# Patient Record
Sex: Female | Born: 1940 | Race: White | Hispanic: No | Marital: Married | Smoking: Former smoker
Health system: Southern US, Community
[De-identification: ages and names within clinical notes are randomized; demographics above are authoritative.]

## PROBLEM LIST (undated history)

## (undated) DIAGNOSIS — Z9889 Other specified postprocedural states: Secondary | ICD-10-CM

## (undated) DIAGNOSIS — K635 Polyp of colon: Secondary | ICD-10-CM

## (undated) DIAGNOSIS — M1990 Unspecified osteoarthritis, unspecified site: Secondary | ICD-10-CM

## (undated) DIAGNOSIS — R209 Unspecified disturbances of skin sensation: Secondary | ICD-10-CM

## (undated) DIAGNOSIS — N059 Unspecified nephritic syndrome with unspecified morphologic changes: Secondary | ICD-10-CM

## (undated) DIAGNOSIS — E039 Hypothyroidism, unspecified: Secondary | ICD-10-CM

## (undated) DIAGNOSIS — R112 Nausea with vomiting, unspecified: Secondary | ICD-10-CM

## (undated) DIAGNOSIS — E079 Disorder of thyroid, unspecified: Secondary | ICD-10-CM

## (undated) DIAGNOSIS — K648 Other hemorrhoids: Secondary | ICD-10-CM

## (undated) DIAGNOSIS — D649 Anemia, unspecified: Secondary | ICD-10-CM

## (undated) DIAGNOSIS — A498 Other bacterial infections of unspecified site: Secondary | ICD-10-CM

## (undated) DIAGNOSIS — K5790 Diverticulosis of intestine, part unspecified, without perforation or abscess without bleeding: Secondary | ICD-10-CM

## (undated) HISTORY — PX: POLYPECTOMY: SHX149

## (undated) HISTORY — DX: Disorder of thyroid, unspecified: E07.9

## (undated) HISTORY — PX: TONSILLECTOMY: SUR1361

## (undated) HISTORY — DX: Unspecified osteoarthritis, unspecified site: M19.90

## (undated) HISTORY — PX: COLONOSCOPY: SHX174

## (undated) HISTORY — PX: TUBAL LIGATION: SHX77

## (undated) HISTORY — DX: Unspecified nephritic syndrome with unspecified morphologic changes: N05.9

---

## 1991-10-13 HISTORY — PX: VAGINAL HYSTERECTOMY: SUR661

## 1998-12-12 ENCOUNTER — Other Ambulatory Visit: Admission: RE | Admit: 1998-12-12 | Discharge: 1998-12-12 | Payer: Self-pay | Admitting: Obstetrics and Gynecology

## 1999-12-24 ENCOUNTER — Other Ambulatory Visit: Admission: RE | Admit: 1999-12-24 | Discharge: 1999-12-24 | Payer: Self-pay | Admitting: Gynecology

## 2001-01-13 ENCOUNTER — Other Ambulatory Visit: Admission: RE | Admit: 2001-01-13 | Discharge: 2001-01-13 | Payer: Self-pay | Admitting: Gynecology

## 2001-03-24 ENCOUNTER — Emergency Department (HOSPITAL_COMMUNITY): Admission: EM | Admit: 2001-03-24 | Discharge: 2001-03-24 | Payer: Self-pay | Admitting: Emergency Medicine

## 2001-03-24 ENCOUNTER — Encounter: Payer: Self-pay | Admitting: Emergency Medicine

## 2001-04-04 ENCOUNTER — Emergency Department (HOSPITAL_COMMUNITY): Admission: EM | Admit: 2001-04-04 | Discharge: 2001-04-04 | Payer: Self-pay | Admitting: Emergency Medicine

## 2002-02-13 ENCOUNTER — Other Ambulatory Visit: Admission: RE | Admit: 2002-02-13 | Discharge: 2002-02-13 | Payer: Self-pay | Admitting: Gynecology

## 2002-10-17 ENCOUNTER — Emergency Department (HOSPITAL_COMMUNITY): Admission: EM | Admit: 2002-10-17 | Discharge: 2002-10-17 | Payer: Self-pay | Admitting: Emergency Medicine

## 2004-08-18 ENCOUNTER — Ambulatory Visit: Payer: Self-pay | Admitting: Family Medicine

## 2004-09-24 ENCOUNTER — Ambulatory Visit: Payer: Self-pay | Admitting: Family Medicine

## 2004-11-10 ENCOUNTER — Encounter: Admission: RE | Admit: 2004-11-10 | Discharge: 2004-11-10 | Payer: Self-pay | Admitting: Family Medicine

## 2004-11-10 ENCOUNTER — Ambulatory Visit: Payer: Self-pay | Admitting: Family Medicine

## 2005-01-26 ENCOUNTER — Ambulatory Visit: Payer: Self-pay | Admitting: Family Medicine

## 2006-11-04 ENCOUNTER — Ambulatory Visit: Payer: Self-pay | Admitting: Internal Medicine

## 2006-11-17 ENCOUNTER — Ambulatory Visit: Payer: Self-pay | Admitting: Internal Medicine

## 2007-09-19 ENCOUNTER — Encounter: Admission: RE | Admit: 2007-09-19 | Discharge: 2007-09-19 | Payer: Self-pay | Admitting: Obstetrics and Gynecology

## 2008-08-03 DIAGNOSIS — K648 Other hemorrhoids: Secondary | ICD-10-CM | POA: Insufficient documentation

## 2008-08-03 DIAGNOSIS — D126 Benign neoplasm of colon, unspecified: Secondary | ICD-10-CM

## 2008-08-03 DIAGNOSIS — K5730 Diverticulosis of large intestine without perforation or abscess without bleeding: Secondary | ICD-10-CM | POA: Insufficient documentation

## 2008-08-07 ENCOUNTER — Ambulatory Visit: Payer: Self-pay | Admitting: Internal Medicine

## 2008-08-07 DIAGNOSIS — R197 Diarrhea, unspecified: Secondary | ICD-10-CM

## 2008-12-14 ENCOUNTER — Encounter: Admission: RE | Admit: 2008-12-14 | Discharge: 2008-12-14 | Payer: Self-pay | Admitting: Obstetrics and Gynecology

## 2011-10-28 ENCOUNTER — Encounter: Payer: Self-pay | Admitting: Internal Medicine

## 2012-06-03 ENCOUNTER — Encounter: Payer: Self-pay | Admitting: Internal Medicine

## 2012-07-20 ENCOUNTER — Ambulatory Visit (AMBULATORY_SURGERY_CENTER): Payer: Medicare Other | Admitting: *Deleted

## 2012-07-20 VITALS — Ht 62.0 in | Wt 126.1 lb

## 2012-07-20 DIAGNOSIS — Z1211 Encounter for screening for malignant neoplasm of colon: Secondary | ICD-10-CM

## 2012-07-20 MED ORDER — MOVIPREP 100 G PO SOLR: 1.0000 | Freq: Once | ORAL | Status: DC

## 2012-08-01 NOTE — Addendum Note (Signed)
Addended by: Maple Hudson on: 08/01/2012 09:35 AM   Modules accepted: Level of Service

## 2012-08-04 ENCOUNTER — Encounter: Payer: Self-pay | Admitting: Internal Medicine

## 2012-08-04 ENCOUNTER — Ambulatory Visit (AMBULATORY_SURGERY_CENTER): Payer: Medicare Other | Admitting: Internal Medicine

## 2012-08-04 VITALS — BP 134/81 | HR 65 | Temp 97.6°F | Resp 16 | Ht 62.0 in | Wt 126.0 lb

## 2012-08-04 DIAGNOSIS — K5790 Diverticulosis of intestine, part unspecified, without perforation or abscess without bleeding: Secondary | ICD-10-CM

## 2012-08-04 DIAGNOSIS — Z86010 Personal history of colonic polyps: Secondary | ICD-10-CM

## 2012-08-04 DIAGNOSIS — Z1211 Encounter for screening for malignant neoplasm of colon: Secondary | ICD-10-CM

## 2012-08-04 DIAGNOSIS — D126 Benign neoplasm of colon, unspecified: Secondary | ICD-10-CM

## 2012-08-04 HISTORY — PX: COLONOSCOPY W/ POLYPECTOMY: SHX1380

## 2012-08-04 HISTORY — DX: Diverticulosis of intestine, part unspecified, without perforation or abscess without bleeding: K57.90

## 2012-08-04 MED ORDER — SODIUM CHLORIDE 0.9 % IV SOLN: 500.0000 mL | INTRAVENOUS | Status: DC

## 2012-08-04 NOTE — Patient Instructions (Signed)
YOU HAD AN ENDOSCOPIC PROCEDURE TODAY AT THE [facility]: Refer to the procedure report that was given to you for any specific questions about what was found during the examination.  If the procedure report does not answer your questions, please call your gastroenterologist to clarify.  If you requested that your care partner not be given the details of your procedure findings, then the procedure report has been included in a sealed envelope for you to review at your convenience later.  YOU SHOULD EXPECT: Some feelings of bloating in the abdomen. Passage of more gas than usual.  Walking can help get rid of the air that was put into your GI tract during the procedure and reduce the bloating. If you had a lower endoscopy (such as a colonoscopy or flexible sigmoidoscopy) you may notice spotting of blood in your stool or on the toilet paper. If you underwent a bowel prep for your procedure, then you may not have a normal bowel movement for a few days.  DIET: Your first meal following the procedure should be a light meal and then it is ok to progress to your normal diet.  A half-sandwich or bowl of soup is an example of a good first meal.  Heavy or fried foods are harder to digest and may make you feel nauseous or bloated.  Likewise meals heavy in dairy and vegetables can cause extra gas to form and this can also increase the bloating.  Drink plenty of fluids but you should avoid alcoholic beverages for 24 hours.  ACTIVITY: Your care partner should take you home directly after the procedure.  You should plan to take it easy, moving slowly for the rest of the day.  You can resume normal activity the day after the procedure however you should NOT DRIVE or use heavy machinery for 24 hours (because of the sedation medicines used during the test).    SYMPTOMS TO REPORT IMMEDIATELY: A gastroenterologist can be reached at any hour.  During normal business hours, 8:30 AM to 5:00 PM Monday through Friday,  call [phone].  After hours and on weekends, please call the GI answering service at [phone] who will take a message and have the physician on call contact you.   Following lower endoscopy (colonoscopy or flexible sigmoidoscopy):  Excessive amounts of blood in the stool  Significant tenderness or worsening of abdominal pains  Swelling of the abdomen that is new, acute  Fever of 100F or higher  Following upper endoscopy (EGD)  Vomiting of blood or coffee ground material  New chest pain or pain under the shoulder blades  Painful or persistently difficult swallowing  New shortness of breath  Fever of 100F or higher  Black, tarry-looking stools  FOLLOW UP: If any biopsies were taken you will be contacted by phone or by letter within the next 1-3 weeks.  Call your gastroenterologist if you have not heard about the biopsies in 3 weeks.  Our staff will call the home number listed on your records the next business day following your procedure to check on you and address any questions or concerns that you may have at that time regarding the information given to you following your procedure. This is a courtesy call and so if there is no answer at the home number and we have not heard from you through the emergency physician on call, we will assume that you have returned to your regular daily activities without incident.  SIGNATURES/CONFIDENTIALITY: You and/or your care   partner have signed paperwork which will be entered into your electronic medical record.  These signatures attest to the fact that that the information above on your After Visit Summary has been reviewed and is understood.  Full responsibility of the confidentiality of this discharge information lies with you and/or your care-partner.  

## 2012-08-04 NOTE — Progress Notes (Signed)
Pt states she had a cold last week and had several nosebleeds out of her right nostril.  No nosebleeds in 4 days

## 2012-08-04 NOTE — Op Note (Signed)
[facility] [address] [place], [address]   COLONOSCOPY PROCEDURE REPORT  PATIENT: Karen, Walsh  MR#: [id number] BIRTHDATE: 1941-04-01 , 71  yrs. old GENDER: Female ENDOSCOPIST: Roxy Cedar, MD REFERRED [id number] Program Recall PROCEDURE DATE:  08/04/2012 PROCEDURE:   Colonoscopy with snare polypectomy    x 3 ASA CLASS:   Class II INDICATIONS:patient's personal history of  colon polyps (index exam 11-2006 NAP looked like adenoma). MEDICATIONS: MAC sedation, administered by CRNA and propofol (Diprivan) 300mg  IV  DESCRIPTION OF PROCEDURE:   After the risks benefits and alternatives of the procedure were thoroughly explained, informed consent was obtained.  A digital rectal exam revealed no abnormalities of the rectum.   The LB [id number]  endoscope was introduced through the anus and advanced to the cecum, which was identified by both the appendix and ileocecal valve. No adverse events experienced.   The quality of the prep was good, using MoviPrep  The instrument was then slowly withdrawn as the colon was fully examined.      COLON FINDINGS: Three polyps ranging between 3-103mm in size were found in the transverse colon (2) and rectum.  A polypectomy was performed with a cold snare.  The resection was complete and the polyp tissue was completely retrieved.   Moderate diverticulosis was noted in the sigmoid colon.   The colon mucosa was otherwise normal.  Retroflexed views revealed internal hemorrhoids. The time to cecum=9 minutes 01 seconds.  Withdrawal time=16 minutes 05 seconds.  The scope was withdrawn and the procedure completed. COMPLICATIONS: There were no complications.  ENDOSCOPIC IMPRESSION: 1. Three polyps ranging between 3-10mm in size were found in the transverse colon and rectum; polypectomy was performed with a cold snare 2. Diverticulosis  RECOMMENDATIONS: 1. Repeat colonoscopy in 5 years if polyp adenomatous;  otherwise 10 years   eSigned:  Roxy Cedar, MD 08/04/2012 8:56 AM   cc: Creola Corn, MD and The Patient   PATIENT NAME:  Karen, Walsh MR#: [id number]

## 2012-08-04 NOTE — Progress Notes (Signed)
Propofol per m smith crna, all meds titrated per CRNA. See scanned intra procedure report. ewm

## 2012-08-04 NOTE — Progress Notes (Signed)
Patient did not experience any of the following events: a burn prior to discharge; a fall within the facility; wrong site/side/patient/procedure/implant event; or a hospital transfer or hospital admission upon discharge from the facility. (G8907) Patient did not have preoperative order for IV antibiotic SSI prophylaxis. (G8918)  

## 2012-08-05 ENCOUNTER — Telehealth: Payer: Self-pay | Admitting: *Deleted

## 2012-08-05 NOTE — Telephone Encounter (Signed)
  Follow up Call-  Call back number 08/04/2012  Post procedure Call Back phone  # [phone]  Permission to leave phone message Yes     Patient questions:  Do you have a fever, pain , or abdominal swelling? no Pain Score  0 *  Have you tolerated food without any problems? yes  Have you been able to return to your normal activities? yes  Do you have any questions about your discharge instructions: Diet   no Medications  no Follow up visit  no  Do you have questions or concerns about your Care? no  Actions: * If pain score is 4 or above: No action needed, pain <4.

## 2012-08-09 ENCOUNTER — Encounter: Payer: Self-pay | Admitting: Internal Medicine

## 2012-12-01 ENCOUNTER — Other Ambulatory Visit (HOSPITAL_COMMUNITY): Payer: Self-pay | Admitting: Internal Medicine

## 2012-12-01 DIAGNOSIS — Z1231 Encounter for screening mammogram for malignant neoplasm of breast: Secondary | ICD-10-CM

## 2013-01-03 ENCOUNTER — Ambulatory Visit (HOSPITAL_COMMUNITY)
Admission: RE | Admit: 2013-01-03 | Discharge: 2013-01-03 | Disposition: A | Discharge status: Home / Self Care | Payer: Medicare Other | Source: Ambulatory Visit | Attending: Internal Medicine | Admitting: Internal Medicine

## 2013-01-03 DIAGNOSIS — Z1231 Encounter for screening mammogram for malignant neoplasm of breast: Secondary | ICD-10-CM | POA: Insufficient documentation

## 2013-01-03 DIAGNOSIS — Z780 Asymptomatic menopausal state: Secondary | ICD-10-CM | POA: Insufficient documentation

## 2013-01-03 DIAGNOSIS — Z13820 Encounter for screening for osteoporosis: Secondary | ICD-10-CM | POA: Insufficient documentation

## 2013-01-03 DIAGNOSIS — N959 Unspecified menopausal and perimenopausal disorder: Secondary | ICD-10-CM

## 2013-01-05 ENCOUNTER — Other Ambulatory Visit: Payer: Self-pay | Admitting: Internal Medicine

## 2013-01-05 DIAGNOSIS — R928 Other abnormal and inconclusive findings on diagnostic imaging of breast: Secondary | ICD-10-CM

## 2013-01-18 ENCOUNTER — Ambulatory Visit
Admission: RE | Admit: 2013-01-18 | Discharge: 2013-01-18 | Disposition: A | Discharge status: Home / Self Care | Payer: Medicare Other | Source: Ambulatory Visit | Attending: Internal Medicine | Admitting: Internal Medicine

## 2013-01-18 DIAGNOSIS — R928 Other abnormal and inconclusive findings on diagnostic imaging of breast: Secondary | ICD-10-CM

## 2013-08-03 ENCOUNTER — Other Ambulatory Visit: Payer: Self-pay | Admitting: Internal Medicine

## 2013-08-03 DIAGNOSIS — N644 Mastodynia: Secondary | ICD-10-CM

## 2013-08-16 ENCOUNTER — Ambulatory Visit
Admission: RE | Admit: 2013-08-16 | Discharge: 2013-08-16 | Disposition: A | Discharge status: Home / Self Care | Payer: Medicare Other | Source: Ambulatory Visit | Attending: Internal Medicine | Admitting: Internal Medicine

## 2013-08-16 DIAGNOSIS — N644 Mastodynia: Secondary | ICD-10-CM

## 2014-07-17 ENCOUNTER — Encounter: Payer: Self-pay | Admitting: Internal Medicine

## 2015-04-04 ENCOUNTER — Other Ambulatory Visit (HOSPITAL_COMMUNITY): Payer: Self-pay | Admitting: Internal Medicine

## 2015-04-04 DIAGNOSIS — Z1231 Encounter for screening mammogram for malignant neoplasm of breast: Secondary | ICD-10-CM

## 2015-04-11 ENCOUNTER — Ambulatory Visit (HOSPITAL_COMMUNITY)
Admission: RE | Admit: 2015-04-11 | Discharge: 2015-04-11 | Disposition: A | Discharge status: Home / Self Care | Payer: Medicare Other | Source: Ambulatory Visit | Attending: Internal Medicine | Admitting: Internal Medicine

## 2015-04-11 DIAGNOSIS — Z1231 Encounter for screening mammogram for malignant neoplasm of breast: Secondary | ICD-10-CM

## 2015-10-13 DIAGNOSIS — A498 Other bacterial infections of unspecified site: Secondary | ICD-10-CM

## 2015-10-13 HISTORY — DX: Other bacterial infections of unspecified site: A49.8

## 2015-11-05 DIAGNOSIS — Z90710 Acquired absence of both cervix and uterus: Secondary | ICD-10-CM | POA: Diagnosis not present

## 2015-11-05 DIAGNOSIS — Z6821 Body mass index (BMI) 21.0-21.9, adult: Secondary | ICD-10-CM | POA: Diagnosis not present

## 2015-11-05 DIAGNOSIS — Z124 Encounter for screening for malignant neoplasm of cervix: Secondary | ICD-10-CM | POA: Diagnosis not present

## 2015-11-05 DIAGNOSIS — S339XXA Sprain of unspecified parts of lumbar spine and pelvis, initial encounter: Secondary | ICD-10-CM | POA: Diagnosis not present

## 2015-11-05 DIAGNOSIS — Z1272 Encounter for screening for malignant neoplasm of vagina: Secondary | ICD-10-CM | POA: Diagnosis not present

## 2015-11-13 ENCOUNTER — Ambulatory Visit: Payer: PPO | Attending: Internal Medicine | Admitting: Physical Therapy

## 2015-11-13 ENCOUNTER — Encounter: Payer: Self-pay | Admitting: Physical Therapy

## 2015-11-13 DIAGNOSIS — N907 Vulvar cyst: Secondary | ICD-10-CM | POA: Insufficient documentation

## 2015-11-13 DIAGNOSIS — M25551 Pain in right hip: Secondary | ICD-10-CM | POA: Diagnosis not present

## 2015-11-13 DIAGNOSIS — N8189 Other female genital prolapse: Secondary | ICD-10-CM

## 2015-11-13 NOTE — Therapy (Signed)
[facility] [address], [place], [place], [address] Phone: [phone]   Fax:  [phone]  Physical Therapy Evaluation  Patient Details  Name: Karen Walsh MRN: [id number] Date of Birth: 08-16-1941 Referring Provider: Dr. Shon Baton  Encounter Date: 11/13/2015      PT End of Session - 11/13/15 1122    Visit Number 1   Number of Visits 10  Medicare   Date for PT Re-Evaluation 01/08/16   PT Start Time [id number]   PT Stop Time 1115   PT Time Calculation (min) 60 min   Activity Tolerance Patient limited by pain   Behavior During Therapy [facility] for tasks assessed/performed      Past Medical History  Diagnosis Date  . Arthritis     thumb  . Thyroid disease   . Nephritis     childhood    Past Surgical History  Procedure Laterality Date  . Vaginal hysterectomy  1993  . Tonsillectomy      childhood    There were no vitals filed for this visit.  Visit Diagnosis:  Hip pain, acute, right - Plan: PT plan of care cert/re-cert  Perineal floor weakness - Plan: PT plan of care cert/re-cert      Subjective Assessment - 11/13/15 1024    Subjective Patient reports 2 weeks ago a pulled muscle in right groin into low back with sudden onset.  Patient saw Dr. Helane Rima last week.  Patient has vaginal dryness. Patient has trouble with constipation and has hemmorriod.    How long can you sit comfortably? No pain   How long can you stand comfortably? stand and move, sit to stand and back   How long can you walk comfortably? occasionally feels pain with walking   Patient Stated Goals Reduce vaginal dryness,    Currently in Pain? 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 Shon Baton   Onset Date/Surgical Date 10/30/15   Prior Therapy None   Precautions   Precautions None   Balance Screen   Has the patient fallen in the past 6 months No   Has the patient had a decrease in activity level because of a fear of falling?  No   Is the patient reluctant to leave their home because of a fear of falling?  No   Prior Function   Level of Independence Independent   Vocation Retired   Leisure exercise at gym   Cognition   Overall Cognitive Status Within Functional Limits for tasks assessed   Observation/Other Assessments   Focus on Therapeutic Outcomes (FOTO)  25% limitation CJ  18% limitation   AROM   Overall AROM Comments lumbar ROM is full   Palpation   Spinal mobility L3-L5 rotated left   SI assessment  right ilium is rotated anteriorly;    Palpation comment palpable tenderness located in medical aspect of the right rectus femorus, right levator ani   Special Tests  Special Tests Sacrolliac Tests   Sacroiliac Tests  Pelvic Distraction   Pelvic Dictraction   Findings Positive   Side  Right   Comment pain                 Pelvic Floor Special Questions - 11/13/15 0001    Currently Sexually Active No   Urinary Leakage Yes   How often 3 times ever   Falling out feeling (prolapse) No   Exam Type Vaginal   Palpation tenderness located on right obturator internist, bil, puborectalis and iliococcygeus   Strength fair squeeze, definite lift  no circular contraction   Tone increased tone in pelvic floor muscles          [facility] Adult PT Treatment/Exercise - 11/13/15 0001    Manual Therapy   Manual Therapy Internal Pelvic Floor;Muscle Energy Technique   Internal Pelvic Floor soft tissue work to bil. puborectalis and right obturator internist   Muscle Energy Technique correct anteriorly rotated right ilium                 PT Education - 11/13/15 1122    Education provided Yes   Education Details toileting technique, abdominal massage, lubricants, pelvic floor massage   Person(s) Educated Patient   Methods Explanation;Demonstration;Verbal cues;Handout   Comprehension Returned demonstration;Verbalized understanding          PT Short Term Goals - 11/13/15 1129    PT SHORT TERM GOAL #1   Title understand correct toileting technique to reduce strain on pelvic floor   Time 4   Period Weeks   Status New   PT SHORT TERM GOAL #2   Title understand how to perform abdominal massage to decreased strain on pelvic floor   Time 4   Period Weeks   Status New   PT SHORT TERM GOAL #3   Title independent with initial HEP   Time 4   Period Weeks   Status New   PT SHORT TERM GOAL #4   Title pain getting up and down from a chair decreased >/= 25%   Time 4   Period Weeks   Status New           PT Long Term Goals - 11/13/15 1131    PT LONG TERM GOAL #1   Title Independent with HEP and how to progress herself   Time 8   Period Weeks   Status New   PT LONG TERM GOAL #2   Title understand how to manage a prolpase to reduce progression   Time 8   Period Weeks   Status New   PT LONG TERM GOAL #3   Title pelvic floor strength >/= 4/5 with circular contraction   Time 8   Period Weeks   Status New   PT LONG TERM GOAL #4   Title report pain with getting up and down from a chair   Time 8   Period Weeks   Status New   PT LONG TERM GOAL #5   Title understand ways to reduce vaginal dryness that causes burning and pain   Time 8   Period Weeks   Status New               Plan - 11/13/15 1124    Clinical Impression Statement Patient is a 75 year old female with diagnosis of female genital prolapse that began 2 weeks ago with suddne onset and with right hip pain.  Patient reports vaginal dryness that has been causing  burning and pain. FOTO score is 25% limitation.  Patient  reports her pain is intermittent in right groin and hip at level 2/10 that is worse with up and down from a chair.  right ilium is anteriorly rotated and L3 - L5 to left.  Palpable tenderness located in righ tobturator internist, bilateral puborectalis and bilateral iliocooccygeus.  Pelvic floor strength is 3/5 with no circular contraction.  Pelvic floor has increased tone. Patient reports she has issue with constipation and trouble fully emptying her bowels.  Patient is of low complexity.  After  manual skills patient pelvis was in correct alignment and had reduced pain. Patient would benefit from skilled therapy to reduce pain and improve strength.    Pt will benefit from skilled therapeutic intervention in order to improve on the following deficits Pain;Decreased activity tolerance;Decreased endurance;Decreased strength;Decreased mobility   Rehab Potential Excellent   Clinical Impairments Affecting Rehab Potential None   PT Frequency 1x / week   PT Duration 8 weeks   PT Treatment/Interventions Ultrasound;Cryotherapy;Electrical Stimulation;Iontophoresis 4mg /ml Dexamethasone;Moist Heat;Therapeutic exercise;Neuromuscular re-education;Patient/family education;Manual techniques;Dry needling;Passive range of motion   PT Next Visit Plan soft tissue work, flexibility exercises, pelvic floor strength   PT Home Exercise Plan review HEP given to patient   Recommended Other Services None   Consulted and Agree with Plan of Care Patient          G-Codes - 18-Nov-2015 1136    Functional Assessment Tool Used FOTO score is 25% limitation  goal is 18% limitation   Functional Limitation Other PT primary   Other PT Primary Current Status [id number]) At least 20 percent but less than 40 percent impaired, limited or restricted   Other PT Primary Goal Status [id number]) At least 1 percent but less than 20 percent impaired, limited or restricted       Problem List Patient Active Problem List   Diagnosis Date Noted  .  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 COLONIC POLYPS 08/03/2008  . HEMORRHOIDS, INTERNAL 08/03/2008  . DIVERTICULOSIS, COLON 08/03/2008    Earlie Counts, PT 11-18-15 11:37 AM   [facility] [address], [place], [place], [address] Phone: [phone]   Fax:  [phone]  Name: Karen Walsh MRN: [id number] Date of Birth: 1941-01-16

## 2015-11-13 NOTE — Patient Instructions (Signed)
STRETCHING THE PELVIC FLOOR MUSCLES NO DILATOR  Supplies . Vaginal lubricant . Mirror (optional) . Gloves (optional) Positioning . Start in a semi-reclined position with your head propped up. Bend your knees and place your thumb or finger at the vaginal opening. Procedure . Apply a moderate amount of lubricant on the outer skin of your vagina, the labia minora.  Apply additional lubricant to your finger. Marland Kitchen Spread the skin away from the vaginal opening. Place the end of your finger at the opening. . Do a maximum contraction of the pelvic floor muscles. Tighten the vagina and the anus maximally and relax. . When you know they are relaxed, gently and slowly insert your finger into your vagina, directing your finger slightly downward, for 2-3 inches of insertion. . Relax and stretch the 6 o'clock position . Hold each stretch for _2 min__ and repeat __1_ time with rest breaks of _1__ seconds between each stretch. . Repeat the stretching in the 4 o'clock and 8 o'clock positions. . Total time should be _6__ minutes, _1__ x per day.  Note the amount of theme your were able to achieve and your tolerance to your finger in your vagina. . Once you have accomplished the techniques you may try them in standing with one foot resting on the tub, or in other positions.  This is a good stretch to do in the shower if you don't need to use lubricant.   Toileting Techniques for Bowel Movements (Defecation) Using your belly (abdomen) and pelvic floor muscles to have a bowel movement is usually instinctive.  Sometimes people can have problems with these muscles and have to relearn proper defecation (emptying) techniques.  If you have weakness in your muscles, organs that are falling out, decreased sensation in your pelvis, or ignore your urge to go, you may find yourself straining to have a bowel movement.  You are straining if you are: . holding your breath or taking in a huge gulp of air and holding it  . keeping  your lips and jaw tensed and closed tightly . turning red in the face because of excessive pushing or forcing . developing or worsening your  hemorrhoids . getting faint while pushing . not emptying completely and have to defecate many times a day  If you are straining, you are actually making it harder for yourself to have a bowel movement.  Many people find they are pulling up with the pelvic floor muscles and closing off instead of opening the anus. Due to lack pelvic floor relaxation and coordination the abdominal muscles, one has to work harder to push the feces out.  Many people have never been taught how to defecate efficiently and effectively.  Notice what happens to your body when you are having a bowel movement.  While you are sitting on the toilet pay attention to the following areas: . Jaw and mouth position . Angle of your hips   . Whether your feet touch the ground or not . Arm placement  . Spine position . Waist . Belly tension . Anus (opening of the anal canal)  An Evacuation/Defecation Plan   Here are the 4 basic points:  1. Lean forward enough for your elbows to rest on your knees 2. Support your feet on the floor or use a low stool if your feet don't touch the floor  3. Push out your belly as if you have swallowed a beach ball-you should feel a widening of your waist 4. Open and relax your  pelvic floor muscles, rather than tightening around the anus      The following conditions my require modifications to your toileting posture:  . If you have had surgery in the past that limits your back, hip, pelvic, knee or ankle flexibility . Constipation   Your healthcare practitioner may make the following additional suggestions and adjustments:  1) Sit on the toilet  a) Make sure your feet are supported. b) Notice your hip angle and spine position-most people find it effective to lean forward or raise their knees, which can help the muscles around the anus to relax   c) When you lean forward, place your forearms on your thighs for support  2) Relax suggestions a) Breath deeply in through your nose and out slowly through your mouth as if you are smelling the flowers and blowing out the candles. b) To become aware of how to relax your muscles, contracting and releasing muscles can be helpful.  Pull your pelvic floor muscles in tightly by using the image of holding back gas, or closing around the anus (visualize making a circle smaller) and lifting the anus up and in.  Then release the muscles and your anus should drop down and feel open. Repeat 5 times ending with the feeling of relaxation. c) Keep your pelvic floor muscles relaxed; let your belly bulge out. d) The digestive tract starts at the mouth and ends at the anal opening, so be sure to relax both ends of the tube.  Place your tongue on the roof of your mouth with your teeth separated.  This helps relax your mouth and will help to relax the anus at the same time.  3) Empty (defecation) a) Keep your pelvic floor and sphincter relaxed, then bulge your anal muscles.  Make the anal opening wide.  b) Stick your belly out as if you have swallowed a beach ball. c) Make your belly wall hard using your belly muscles while continuing to breathe. Doing this makes it easier to open your anus. d) Breath out and give a grunt (or try using other sounds such as ahhhh, shhhhh, ohhhh or grrrrrrr).  4) Finish a) As you finish your bowel movement, pull the pelvic floor muscles up and in.  This will leave your anus in the proper place rather than remaining pushed out and down. If you leave your anus pushed out and down, it will start to feel as though that is normal and give you incorrect signals about needing to have a bowel movement.    Karen Walsh, PT [facility] [place] [address] [place], [place] [address]  Lubrication . Used for intercourse to reduce friction . Avoid ones that have  glycerin, warming gels, tingling gels, icing or cooling gel, scented . May need to be reapplied once or several times during sexual activity . Can be applied to both partners genitals prior to vaginal penetration to minimize friction or irritation . Prevent irritation and mucosal tears that cause post coital pain and increased the risk of vaginal and urinary tract infections . Oil-based lubricants cannot be used with condoms due to breaking them down.  Least likely to irritate vaginal tissue.  . Plant based-lubes are safe . Silicone-based lubrication are thicker and last long and used for post-menopausal women Types of Lubricants . Good Clean Love . Slippery Stuff . Sylk . Blossom Organics . Samul Dada . Coconut oil . Aloe Vera . Sliquid  Things to avoid in the vaginal area . Do not  use things to irritate the vulvar area . No lotions . No soaps; can use Aveeno or Calendula cleanser if needed. Must be gentle . No deodorants . No douches . Good to sleep without underwear to let the vaginal area to air out . No scrubbing: spread the lips to let warm water rinse over labias and pat dry About Abdominal Massage  Abdominal massage, also called external colon massage, is a self-treatment circular massage technique that can reduce and eliminate gas and ease constipation. The colon naturally contracts in waves in a clockwise direction starting from inside the right hip, moving up toward the ribs, across the belly, and down inside the left hip.  When you perform circular abdominal massage, you help stimulate your colon's normal wave pattern of movement called peristalsis.  It is most beneficial when done after eating.  Positioning You can practice abdominal massage with oil while lying down, or in the shower with soap.  Some people find that it is just as effective to do the massage through clothing while sitting or standing.  How to Massage Start by placing your finger tips or knuckles on your  right side, just inside your hip bone.  . Make small circular movements while you move upward toward your rib cage.   . Once you reach the bottom right side of your rib cage, take your circular movements across to the left side of the bottom of your rib cage.  . Next, move downward until you reach the inside of your left hip bone.  This is the path your feces travel in your colon. . Continue to perform your abdominal massage in this pattern for 10 minutes each day.     You can apply as much pressure as is comfortable in your massage.  Start gently and build pressure as you continue to practice.  Notice any areas of pain as you massage; areas of slight pain may be relieved as you massage, but if you have areas of significant or intense pain, consult with your healthcare provider.  Other Considerations . General physical activity including bending and stretching can have a beneficial massage-like effect on the colon.  Deep breathing can also stimulate the colon because breathing deeply activates the same nervous system that supplies the colon.   . Abdominal massage should always be used in combination with a bowel-conscious diet that is high in the proper type of fiber for you, fluids (primarily water), and a regular exercise program.

## 2015-11-20 ENCOUNTER — Encounter: Payer: Self-pay | Admitting: Physical Therapy

## 2015-11-20 ENCOUNTER — Ambulatory Visit: Payer: PPO | Admitting: Physical Therapy

## 2015-11-20 DIAGNOSIS — N8189 Other female genital prolapse: Secondary | ICD-10-CM

## 2015-11-20 DIAGNOSIS — M25551 Pain in right hip: Secondary | ICD-10-CM

## 2015-11-20 NOTE — Patient Instructions (Addendum)
Bridge [employer] small of back into mat, maintain pelvic tilt, roll up one vertebrae at a time. Focus on engaging posterior hip muscles. Hold for _5___ breaths. Repeat _10___ times. Make sure hips are leveled Copyright  VHI. All rights reserved.   Bracing With Knee Fallout (Hook-Lying)    With neutral spine, tighten pelvic floor and abdominals and hold. Alternating legs, drop knee out to side. Keep opposite hip still. Repeat __10_ times each leg Do _1__ times a day.   Copyright  VHI. All rights reserved.  Bracing With Knee Fallout (Hook-Lying)    With neutral spine, tighten pelvic floor and abdominals and hold. Alternating legs, drop knee out to side. Keep opposite hip still. Repeat ___ times. Do ___ times a day.   Copyright  VHI. All rights reserved.  Sit to Stand: Phase 3    Sitting, squeeze pelvic floor and hold. Lean trunk forward. Push up on arms and stand up. Relax. Whenever you get up and down from a chair.  Copyright  VHI. All rights reserved.  [place] [address] [place], [place] [address] Phone # [phone] Fax [phone]

## 2015-11-20 NOTE — Therapy (Signed)
[facility] [address] [place], [place], [address] Phone: [phone]   Fax:  [phone]  Physical Therapy Treatment  Patient Details  Name: Karen Walsh MRN: [id number] Date of Birth: Sep 03, 1941 Referring Provider: Dr. Shon Baton  Encounter Date: 11/20/2015      PT End of Session - 11/20/15 1102    Visit Number 2   Number of Visits 10  Medicare   Date for PT Re-Evaluation 01/08/16   PT Start Time 1100   PT Stop Time 1140   PT Time Calculation (min) 40 min   Activity Tolerance Patient tolerated treatment well   Behavior During Therapy [facility] for tasks assessed/performed      Past Medical History  Diagnosis Date  . Arthritis     thumb  . Thyroid disease   . Nephritis     childhood    Past Surgical History  Procedure Laterality Date  . Vaginal hysterectomy  1993  . Tonsillectomy      childhood    There were no vitals filed for this visit.  Visit Diagnosis:  Hip pain, acute, right  Perineal floor weakness      Subjective Assessment - 11/20/15 1103    Subjective I felt great after the evaluation.  I had pain with the exercise next day.    How long can you sit comfortably? No pain   How long can you stand comfortably? stand and move, sit to stand and back   How long can you walk comfortably? occasionally feels pain with walking   Patient Stated Goals Reduce vaginal dryness,    Currently in Pain? Yes   Pain Score 2   high 4/10   Pain Location Hip   Pain Orientation Right   Pain Descriptors / Indicators Dull   Pain Type Acute pain   Pain Onset 1 to 4 weeks ago   Pain Frequency Intermittent   Aggravating Factors  stand to sit   Pain Relieving Factors stops the movement   Multiple Pain Sites No                      Pelvic Floor Special Questions - 11/20/15 0001    Pelvic Floor Internal Exam Patient approves physical therapist to perform internal soft tissue work to the  muscles.    Exam Type Vaginal   Strength good squeeze, good lift, able to hold agaisnt strong resistance           [facility] Adult PT Treatment/Exercise - 11/20/15 0001    Manual Therapy   Manual Therapy Soft tissue mobilization;Internal Pelvic Floor   Soft tissue mobilization posterior right hip and around greater trochanter   Internal Pelvic Floor soft tissue work to right obturator internist, right levator ani   Muscle Energy Technique correct anteriorly rotated right ilium                PT Education - 11/20/15 1137    Education provided Yes   Education Details bridge, hookly witih pelvic floor contraction wiht hip IR/ER, sit to stand and back with pelvic floor contraction   Person(s) Educated Patient   Methods Explanation;Demonstration;Verbal cues;Handout   Comprehension Returned demonstration;Verbalized understanding          PT Short Term Goals - 11/20/15 1132    PT SHORT TERM GOAL #1   Title understand correct toileting technique to reduce strain on pelvic floor   Time 4   Period Weeks   Status Achieved  PT SHORT TERM GOAL #2   Title understand how to perform abdominal massage to decreased strain on pelvic floor   Time 4   Period Weeks   Status Achieved   PT SHORT TERM GOAL #3   Title independent with initial HEP   Period Weeks   Status On-going   PT SHORT TERM GOAL #4   Title pain getting up and down from a chair decreased >/= 25%   Time 4   Period Weeks   Status On-going           PT Long Term Goals - 11/13/15 1131    PT LONG TERM GOAL #1   Title Independent with HEP and how to progress herself   Time 8   Period Weeks   Status New   PT LONG TERM GOAL #2   Title understand how to manage a prolpase to reduce progression   Time 8   Period Weeks   Status New   PT LONG TERM GOAL #3   Title pelvic floor strength >/= 4/5 with circular contraction   Time 8   Period Weeks   Status New   PT LONG TERM GOAL #4   Title report pain with getting  up and down from a chair   Time 8   Period Weeks   Status New   PT LONG TERM GOAL #5   Title understand ways to reduce vaginal dryness that causes burning and pain   Time 8   Period Weeks   Status New               Plan - 11/20/15 1138    Clinical Impression Statement Patient is a 75 year old female with diagnosis of female genital prolapse.  Patient hip pain persist.  Palpable tenderness located in right posterior hip and righ tobturator internist and levator ani.  Patient pelvic floor strength has improved to 4/5 but  hold 5 seconds.  After therapy pelvis in the correct alignment.  After therapy patient had no pain with getting up and down from chair with pelvic floor contraction. Patient will benefit form skilled therapy to reduce pain.    Pt will benefit from skilled therapeutic intervention in order to improve on the following deficits Pain;Decreased activity tolerance;Decreased endurance;Decreased strength;Decreased mobility   Rehab Potential Excellent   Clinical Impairments Affecting Rehab Potential None   PT Frequency 1x / week   PT Duration 8 weeks   PT Treatment/Interventions Ultrasound;Cryotherapy;Electrical Stimulation;Iontophoresis 4mg /ml Dexamethasone;Moist Heat;Therapeutic exercise;Neuromuscular re-education;Patient/family education;Manual techniques;Dry needling;Passive range of motion   PT Next Visit Plan soft tissue work, flexibility exercises, pelvic floor strength   PT Home Exercise Plan moistrizers   Consulted and Agree with Plan of Care Patient        Problem List Patient Active Problem List   Diagnosis Date Noted  . 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 COLONIC POLYPS 08/03/2008  . HEMORRHOIDS, INTERNAL 08/03/2008  . DIVERTICULOSIS, COLON 08/03/2008    Earlie Counts, PT 11/20/2015 11:42 AM    [facility] [address], [place], [place], [address] Phone: [phone]   Fax:   [phone]  Name: Karen Walsh MRN: [id number] Date of Birth: [birth year]

## 2015-11-27 ENCOUNTER — Ambulatory Visit: Payer: PPO | Admitting: Physical Therapy

## 2015-11-27 ENCOUNTER — Encounter: Payer: Self-pay | Admitting: Physical Therapy

## 2015-11-27 DIAGNOSIS — M25551 Pain in right hip: Secondary | ICD-10-CM

## 2015-11-27 DIAGNOSIS — N8189 Other female genital prolapse: Secondary | ICD-10-CM

## 2015-11-27 NOTE — Patient Instructions (Addendum)
Lubrication . Used for intercourse to reduce friction . Avoid ones that have glycerin, warming gels, tingling gels, icing or cooling gel, scented . May need to be reapplied once or several times during sexual activity . Can be applied to both partners genitals prior to vaginal penetration to minimize friction or irritation . Prevent irritation and mucosal tears that cause post coital pain and increased the risk of vaginal and urinary tract infections . Oil-based lubricants cannot be used with condoms due to breaking them down.  Least likely to irritate vaginal tissue.  . Plant based-lubes are safe . Silicone-based lubrication are thicker and last long and used for post-menopausal women Types of Lubricants . Good Clean Love (water based)-[employer], [employer], [employer], [employer] . Slippery Stuff- order on line . Sylk . Blossom Organics . Samul Dada . Coconut oil . Aloe Vera . Sliquid- order on line  Things to avoid in the vaginal area . Do not use things to irritate the vulvar area . No lotions . No soaps; can use Aveeno or Calendula cleanser if needed. Must be gentle . No deodorants . No douches . Good to sleep without underwear to let the vaginal area to air out . No scrubbing: spread the lips to let warm water rinse over labias and pat dry   Moisturizers . They are used in the vagina to hydrate the mucous membrane that make up the vaginal canal. . Designed to keep a more normal acid balance (ph) . Once placed in the vagina, it will last between two to three days.  . Use 2-3 times per week at bedtime and last longer than 60 min. . Ingredients to avoid is glycerin and fragrance, can increase chance of infection . Should not be used just before sex due to causing irritation . Most are gels administered either in a tampon-shaped applicator or as a vaginal suppository. They are non-hormonal.   Types of Moisturizers . Replens . Samul Dada . Vitamin E vaginal suppositories . Moist  Again . Feminease . Coconut oil- can break down condoms . Hyalo  Things to avoid in the vaginal area . Do not use things to irritate the vulvar area . No lotions . No soaps; can use Aveeno or Calendula cleanser if needed. Must be gentle . No deodorants . No douches . Good to sleep without underwear to let the vaginal area to air out . No scrubbing: spread the lips to let warm water rinse over labias and pat dry  Deep Squat    Squat and lift with both arms held against upper trunk. Tighten stomach muscles without holding breath. Use smooth movements to avoid jerking.  Copyright  VHI. All rights reserved. Lifting Principles  .Maintain proper posture and head alignment. .Slide object as close as possible before lifting. .Move obstacles out of the way. .Test before lifting; ask for help if too heavy. .Tighten stomach muscles without holding breath. .Use smooth movements; do not jerk. .Use legs to do the work, and pivot with feet. .Distribute the work load symmetrically and close to the center of trunk. .Push instead of pull whenever possible. When exercising at the gym, breathe out on the hardest part of the exercise and pull up pelvic floor.  Copyright  VHI. All rights reserved.  Bracing With Heel Slides (Supine)    With neutral spine, tighten pelvic floor and abdominals and hold. Push belly button to spine. Alternating legs, slide heel to bottom. Repeat _15__ times. Do _1__ times a day.   Copyright  VHI.  All rights reserved.    Bracing With Arms / Unsupported Legs (Hook-Lying)    With neutral spine, tighten pelvic floor and abdominals and hold. Contract pelvic floor.  Lift limbs a few inches. Raise arm over head and bend opposite leg. Then return. Repeat with other limbs. Repeat _10__ times. 2 sets Do _1__ times a day.   Copyright  VHI. All rights reserved.  [place] [facility] [address] [place], [place] [address] Phone # [phone] Fax  [phone]

## 2015-11-27 NOTE — Therapy (Signed)
[facility] [address], [place], [place], [address] Phone: [phone]   Fax:  [phone]  Physical Therapy Treatment  Patient Details  Name: Karen Walsh MRN: [id number] Date of Birth: 1941-05-28 Referring Provider: Dr. Shon Baton  Encounter Date: 11/27/2015      PT End of Session - 11/27/15 1106    Visit Number 3   Number of Visits 10  Medicare   Date for PT Re-Evaluation 01/08/16   PT Start Time 1105   PT Stop Time 1145   PT Time Calculation (min) 40 min   Activity Tolerance Patient tolerated treatment well   Behavior During Therapy [facility] for tasks assessed/performed      Past Medical History  Diagnosis Date  . Arthritis     thumb  . Thyroid disease   . Nephritis     childhood    Past Surgical History  Procedure Laterality Date  . Vaginal hysterectomy  1993  . Tonsillectomy      childhood    There were no vitals filed for this visit.  Visit Diagnosis:  Hip pain, acute, right  Perineal floor weakness      Subjective Assessment - 11/27/15 1106    Subjective I am not having hip pain. I am using the Estrace and cutting back.    How long can you sit comfortably? No pain   How long can you stand comfortably? stand and move, sit to stand and back   Patient Stated Goals Reduce vaginal dryness,    Currently in Pain? No/denies            [facility] PT Assessment - 11/27/15 0001    Palpation   SI assessment  pelvis in correct alignment   Special Tests    Special Tests Sacrolliac Tests   Sacroiliac Tests  Pelvic Distraction   Pelvic Dictraction   Findings Negative   Side  Right   Comment no pain                     [facility] Adult PT Treatment/Exercise - 11/27/15 0001    Self-Care   Self-Care Other Self-Care Comments   Other Self-Care Comments  using moisturizers and lubricants to decrease vaginal dryness   Therapeutic Activites    Therapeutic Activities Other Therapeutic  Activities;Lifting   Lifting lifting with pelvic floor contraction, breathing to prevent valsalva manuever; sit to stand without contracting gluteal   Other Therapeutic Activities workout in gym  with breathing and not straining pelvic floor   Lumbar Exercises: Supine   Heel Slides 20 reps;Other (comment)  verbal cues to contract abdomen   Dead Bug 10 reps  2 sets                PT Education - 11/27/15 1134    Education provided Yes   Education Details abdominal exercises; moisturizer, lubricant   Person(s) Educated Patient   Methods Explanation;Demonstration;Verbal cues;Handout   Comprehension Returned demonstration;Verbalized understanding          PT Short Term Goals - 11/27/15 1135    PT SHORT TERM GOAL #1   Title understand correct toileting technique to reduce strain on pelvic floor   Period Weeks   Status Achieved   PT SHORT TERM GOAL #2   Title understand how to perform abdominal massage to decreased strain on pelvic floor   Time 4   Period Weeks   Status Achieved   PT SHORT TERM GOAL #3   Title  independent with initial HEP   Time 4   Period Weeks   Status Achieved   PT SHORT TERM GOAL #4   Title pain getting up and down from a chair decreased >/= 25%   Time 4   Period Weeks   Status Achieved           PT Long Term Goals - 11/13/15 1131    PT LONG TERM GOAL #1   Title Independent with HEP and how to progress herself   Time 8   Period Weeks   Status New   PT LONG TERM GOAL #2   Title understand how to manage a prolpase to reduce progression   Time 8   Period Weeks   Status New   PT LONG TERM GOAL #3   Title pelvic floor strength >/= 4/5 with circular contraction   Time 8   Period Weeks   Status New   PT LONG TERM GOAL #4   Title report pain with getting up and down from a chair   Time 8   Period Weeks   Status New   PT LONG TERM GOAL #5   Title understand ways to reduce vaginal dryness that causes burning and pain   Time 8    Period Weeks   Status New               Plan - 11/27/15 1140    Clinical Impression Statement Patient is a 75 year old female with 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  Patient reports she has not had hip pain in several days.  Patient has met her STG's.  Patient pelvis in correct slignment.  Patient  is progressing her abdominal and pelvic floor strength.    Pt will benefit from skilled therapeutic intervention in order to improve on the following deficits Pain;Decreased activity tolerance;Decreased endurance;Decreased strength;Decreased mobility   Rehab Potential Excellent   Clinical Impairments Affecting Rehab Potential None   PT Frequency 1x / week   PT Duration 8 weeks   PT Treatment/Interventions Ultrasound;Cryotherapy;Electrical Stimulation;Iontophoresis 22m/ml Dexamethasone;Moist Heat;Therapeutic exercise;Neuromuscular re-education;Patient/family education;Manual techniques;Dry needling;Passive range of motion   PT Next Visit Plan progress pelvic floor and abdominal strength   PT Home Exercise Plan progress as needed   Consulted and Agree with Plan of Care Patient        Problem List Patient Active Problem List   Diagnosis Date Noted  . 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 COLONIC POLYPS 08/03/2008  . HEMORRHOIDS, INTERNAL 08/03/2008  . DIVERTICULOSIS, COLON 08/03/2008    CEarlie Counts PT 11/27/2015 11:42 AM    [place] [facility] [address] [place] [address]Phone: [phone]  Fax:  [phone] Name: Karen YOUNCEMRN: [id number] of Birth: 612-03-1941

## 2015-12-04 ENCOUNTER — Ambulatory Visit: Payer: PPO | Admitting: Physical Therapy

## 2015-12-11 ENCOUNTER — Ambulatory Visit: Payer: PPO | Admitting: Physical Therapy

## 2015-12-13 DIAGNOSIS — A047 Enterocolitis due to Clostridium difficile: Secondary | ICD-10-CM | POA: Diagnosis not present

## 2015-12-13 DIAGNOSIS — Z6820 Body mass index (BMI) 20.0-20.9, adult: Secondary | ICD-10-CM | POA: Diagnosis not present

## 2015-12-13 DIAGNOSIS — R197 Diarrhea, unspecified: Secondary | ICD-10-CM | POA: Diagnosis not present

## 2015-12-18 ENCOUNTER — Encounter: Payer: Self-pay | Admitting: Physical Therapy

## 2015-12-18 ENCOUNTER — Ambulatory Visit: Payer: PPO | Attending: Internal Medicine | Admitting: Physical Therapy

## 2015-12-18 DIAGNOSIS — N907 Vulvar cyst: Secondary | ICD-10-CM | POA: Insufficient documentation

## 2015-12-18 DIAGNOSIS — M25551 Pain in right hip: Secondary | ICD-10-CM | POA: Diagnosis not present

## 2015-12-18 DIAGNOSIS — N8189 Other female genital prolapse: Secondary | ICD-10-CM

## 2015-12-18 NOTE — Therapy (Signed)
[facility] [address], [place], [place], [address] Phone: [phone]   Fax:  [phone]  Physical Therapy Treatment  Patient Details  Name: Karen Walsh MRN: [id number] Date of Birth: [birth year] Referring Provider: Dr. Shon Baton  Encounter Date: 12/18/2015      PT End of Session - 12/18/15 1105    Visit Number 4   Number of Visits 10  Medicare   Date for PT Re-Evaluation 01/08/16   PT Start Time 1103   PT Stop Time 1141   PT Time Calculation (min) 38 min   Activity Tolerance Patient tolerated treatment well   Behavior During Therapy [facility] for tasks assessed/performed      Past Medical History  Diagnosis Date  . Arthritis     thumb  . Thyroid disease   . Nephritis     childhood    Past Surgical History  Procedure Laterality Date  . Vaginal hysterectomy  1993  . Tonsillectomy      childhood    There were no vitals filed for this visit.  Visit Diagnosis:  Hip pain, acute, right  Perineal floor weakness      Subjective Assessment - 12/18/15 1107    Subjective I had a bad cold and just feeling better.  I have c-diff.   How long can you sit comfortably? No pain   How long can you stand comfortably? stand and move, sit to stand and back   How long can you walk comfortably? occasionally feels pain with walking   Patient Stated Goals Reduce vaginal dryness,    Currently in Pain? Yes   Pain Score 1    Pain Location Hip   Pain Orientation Right   Pain Type Acute pain   Pain Onset 1 to 4 weeks ago   Pain Frequency Intermittent   Aggravating Factors  stand to sit   Pain Relieving Factors stop the movement   Multiple Pain Sites No            [facility] PT Assessment - 12/18/15 0001    Palpation   SI assessment  right ilium rotated anteriorly                  Pelvic Floor Special Questions - 12/18/15 0001    Pelvic Floor Internal Exam Patient approves physical therapist to perform  internal soft tissue work to the muscles.    Exam Type Vaginal   Strength good squeeze, good lift, able to hold agaisnt strong resistance           [facility] Adult PT Treatment/Exercise - 12/18/15 0001    Manual Therapy   Manual Therapy Soft tissue mobilization;Muscle Energy Technique;Internal Pelvic Floor   Soft tissue mobilization right hip adductor   Internal Pelvic Floor bil. levaotr ani and bil. obturator internist   Muscle Energy Technique correct anteriorly rotated right ilium                PT Education - 12/18/15 1116    Education provided Yes   Education Details ilium correction on right   Person(s) Educated Patient   Methods Explanation;Demonstration;Verbal cues;Handout   Comprehension Returned demonstration;Verbalized understanding          PT Short Term Goals - 11/27/15 1135    PT SHORT TERM GOAL #1   Title understand correct toileting technique to reduce strain on pelvic floor   Period Weeks   Status Achieved   PT SHORT TERM GOAL #2   Title  understand how to perform abdominal massage to decreased strain on pelvic floor   Time 4   Period Weeks   Status Achieved   PT SHORT TERM GOAL #3   Title independent with initial HEP   Time 4   Period Weeks   Status Achieved   PT SHORT TERM GOAL #4   Title pain getting up and down from a chair decreased >/= 25%   Time 4   Period Weeks   Status Achieved           PT Long Term Goals - 12/18/15 1105    PT LONG TERM GOAL #1   Title Independent with HEP and how to progress herself   Time 8   Period Weeks   Status On-going  still learning   PT LONG TERM GOAL #2   Title understand how to manage a prolpase to reduce progression   Time 8   Period Weeks   Status On-going   PT LONG TERM GOAL #3   Title pelvic floor strength >/= 4/5 with circular contraction   Time 8   Period Weeks   Status On-going   PT LONG TERM GOAL #4   Title report pain with getting up and down from a chair   Time 8   Period  Weeks   Status On-going  pain started after being sick   PT LONG TERM GOAL #5   Title understand ways to reduce vaginal dryness that causes burning and pain   Time 8   Period Weeks   Status On-going               Plan - 12/18/15 1144    Clinical Impression Statement Patient is a 75 year od female with 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  Patient has not met goals due to her being sick and not able to exercise.  Patient right hip has increased pain.  Right ilium is rotated anteriorly.  After therapy no pian in right hip and pelvis in correct alignment. Patient would benefit from physical therapy to reduce pain and increase pelvic floor strength.    Pt will benefit from skilled therapeutic intervention in order to improve on the following deficits Pain;Decreased activity tolerance;Decreased endurance;Decreased strength;Decreased mobility   Rehab Potential Excellent   Clinical Impairments Affecting Rehab Potential None   PT Frequency 1x / week   PT Duration 8 weeks   PT Treatment/Interventions Ultrasound;Cryotherapy;Electrical Stimulation;Iontophoresis 70m/ml Dexamethasone;Moist Heat;Therapeutic exercise;Neuromuscular re-education;Patient/family education;Manual techniques;Dry needling;Passive range of motion   PT Next Visit Plan review vaginal dryness, ways to manage prolapse, posible discharge   PT Home Exercise Plan progress as needed   Consulted and Agree with Plan of Care Patient        Problem List Patient Active Problem List   Diagnosis Date Noted  . 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 COLONIC POLYPS 08/03/2008  . HEMORRHOIDS, INTERNAL 08/03/2008  . DIVERTICULOSIS, COLON 08/03/2008    CEarlie Walsh PT 12/18/2015 11:48 AM    [facility] [address] [place] [address]Phone: [phone]  Fax:  [phone] Name: Karen JARMONMRN: [id number] of Birth: 610-Jul-1942

## 2015-12-18 NOTE — Patient Instructions (Addendum)
Pelvic Rotation: Contract / Relax (Supine)    With knees bent over bolster, right knee crossed over other, press thighs together tightly without allowing movement. Hold __6__ seconds. Relax. Repeat __5__ times per set. Do _1___ sets per session. Do __1__ sessions per day. When the right bone is lower. http://orth.exer.us/280   Copyright  VHI. All rights reserved.  [place] [address] [place], [place] [address] Phone # [phone] Fax [phone]

## 2015-12-24 ENCOUNTER — Encounter: Payer: Self-pay | Admitting: Physical Therapy

## 2015-12-24 ENCOUNTER — Ambulatory Visit: Payer: PPO | Admitting: Physical Therapy

## 2015-12-24 DIAGNOSIS — M25551 Pain in right hip: Secondary | ICD-10-CM

## 2015-12-24 DIAGNOSIS — N8189 Other female genital prolapse: Secondary | ICD-10-CM

## 2015-12-24 NOTE — Therapy (Signed)
[facility] [address], [place], [place], [address] Phone: [phone]   Fax:  [phone]  Physical Therapy Treatment  Patient Details  Name: Karen Walsh MRN: [id number] Date of Birth: November 26, 1940 Referring Provider: Dr. Shon Baton  Encounter Date: 12/24/2015      PT End of Session - 12/24/15 1445    Visit Number 5   Number of Visits 10  Medicare   Date for PT Re-Evaluation 01/08/16   PT Start Time 5726   PT Stop Time 1518   PT Time Calculation (min) 33 min   Activity Tolerance Patient tolerated treatment well   Behavior During Therapy [facility] for tasks assessed/performed      Past Medical History  Diagnosis Date  . Arthritis     thumb  . Thyroid disease   . Nephritis     childhood    Past Surgical History  Procedure Laterality Date  . Vaginal hysterectomy  1993  . Tonsillectomy      childhood    There were no vitals filed for this visit.  Visit Diagnosis:  Hip pain, acute, right  Perineal floor weakness      Subjective Assessment - 12/24/15 1445    Subjective I feel great now. I think today is my last day.    How long can you sit comfortably? No pain   How long can you stand comfortably? stand and move, sit to stand and back   How long can you walk comfortably? occasionally feels pain with walking   Patient Stated Goals Reduce vaginal dryness,    Currently in Pain? No/denies            [facility] PT Assessment - 12/24/15 0001    Assessment   Medical Diagnosis N81.89 Other female genital prolpase   Referring Provider Dr. Shon Baton   Onset Date/Surgical Date 10/30/15   Prior Therapy None   Precautions   Precautions None   Balance Screen   Has the patient fallen in the past 6 months No   Has the patient had a decrease in activity level because of a fear of falling?  No   Is the patient reluctant to leave their home because of a fear of falling?  No   Prior Function   Level of  Independence Independent   Cognition   Overall Cognitive Status Within Functional Limits for tasks assessed   Observation/Other Assessments   Focus on Therapeutic Outcomes (FOTO)  0%   AROM   Overall AROM Comments lumbar ROM is full   PROM   Overall PROM  Within functional limits for tasks performed   Strength   Overall Strength Within functional limits for tasks performed   Palpation   SI assessment  pelvis in correct alignment                  Pelvic Floor Special Questions - 12/24/15 0001    Pelvic Floor Internal Exam Patient approves physical therapist to perform internal soft tissue work to the muscles.    Exam Type Vaginal   Strength good squeeze, good lift, able to hold agaisnt strong resistance           [facility] Adult PT Treatment/Exercise - 12/24/15 0001    Therapeutic Activites    Therapeutic Activities ADL's   Other Therapeutic Activities sleeping position on side with pillows to support knees   Lumbar Exercises: Supine   Clam 10 reps  right, left   Heel Slides 20 reps;Other (  comment)  verbal cues to contract abdomen   Dead Bug 10 reps  2 sets   Bridge 15 reps   Bridge Limitations none                PT Education - Jan 02, 2016 1510    Education provided Yes   Education Details reviewed past HEP and patient is independent   Person(s) Educated Patient   Methods Explanation;Demonstration   Comprehension Verbalized understanding;Returned demonstration          PT Short Term Goals - 11/27/15 1135    PT SHORT TERM GOAL #1   Title understand correct toileting technique to reduce strain on pelvic floor   Period Weeks   Status Achieved   PT SHORT TERM GOAL #2   Title understand how to perform abdominal massage to decreased strain on pelvic floor   Time 4   Period Weeks   Status Achieved   PT SHORT TERM GOAL #3   Title independent with initial HEP   Time 4   Period Weeks   Status Achieved   PT SHORT TERM GOAL #4   Title pain getting up  and down from a chair decreased >/= 25%   Time 4   Period Weeks   Status Achieved           PT Long Term Goals - January 02, 2016 1448    PT LONG TERM GOAL #1   Title Independent with HEP and how to progress herself   Time 8   Period Weeks   Status Achieved   PT LONG TERM GOAL #2   Title understand how to manage a prolpase to reduce progression   Time 8   Period Weeks   Status Achieved   PT LONG TERM GOAL #3   Title pelvic floor strength >/= 4/5 with circular contraction   Time 8   Period Weeks   Status Achieved   PT LONG TERM GOAL #4   Title report pain with getting up and down from a chair   Time 8   Period Weeks   Status Achieved   PT LONG TERM GOAL #5   Title understand ways to reduce vaginal dryness that causes burning and pain   Time 8   Period Weeks   Status Achieved               Plan - 2016-01-02 1511    Clinical Impression Statement Patient is a 75 year old female with diagnosis of female genital prolapse. Patient has met all of her goals.  Patient pelvis in correct alignment.  Patient reports no pain.    Pt will benefit from skilled therapeutic intervention in order to improve on the following deficits Pain;Decreased activity tolerance;Decreased endurance;Decreased strength;Decreased mobility   Rehab Potential Excellent   Clinical Impairments Affecting Rehab Potential None   PT Frequency 1x / week   PT Duration 8 weeks   PT Treatment/Interventions Ultrasound;Cryotherapy;Electrical Stimulation;Iontophoresis 46m/ml Dexamethasone;Moist Heat;Therapeutic exercise;Neuromuscular re-education;Patient/family education;Manual techniques;Dry needling;Passive range of motion   PT Next Visit Plan Discharge to HEP   PT Home Exercise Plan Current HEP   Recommended Other Services None   Consulted and Agree with Plan of Care Patient          G-Codes - 0March 23, 20171520    Functional Assessment Tool Used 0% limitation   Functional Limitation Other PT primary   Other PT  Primary Goal Status (([id number] At least 1 percent but less than 20 percent impaired, limited or restricted   Other  PT Primary Discharge Status [phone]) 0 percent impaired, limited or restricted      Problem List Patient Active Problem List   Diagnosis Date Noted  . 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 COLONIC POLYPS 08/03/2008  . HEMORRHOIDS, INTERNAL 08/03/2008  . DIVERTICULOSIS, COLON 08/03/2008    Earlie Counts, PT 12/24/2015 3:23 PM   [facility] [address], [place], [place], [address] Phone: [phone]   Fax:  [phone]  Name: SOMALIA SEGLER MRN: [id number] Date of Birth: 11-Jan-1941    PHYSICAL THERAPY DISCHARGE SUMMARY  Visits from Start of Care: 5  Current functional level related to goals / functional outcomes: See above.  Patient has met all goals and has not pain.    Remaining deficits: None.  See above.   Education / Equipment: HEP Plan: Patient agrees to discharge.  Patient goals were met. Patient is being discharged due to meeting the stated rehab goals. Thank you for the referral. Earlie Counts, PT 12/24/2015 3:23 PM   ?????

## 2015-12-31 ENCOUNTER — Encounter: Payer: PPO | Admitting: Physical Therapy

## 2016-01-08 ENCOUNTER — Encounter: Payer: PPO | Admitting: Physical Therapy

## 2016-01-29 DIAGNOSIS — H52203 Unspecified astigmatism, bilateral: Secondary | ICD-10-CM | POA: Diagnosis not present

## 2016-01-29 DIAGNOSIS — H04123 Dry eye syndrome of bilateral lacrimal glands: Secondary | ICD-10-CM | POA: Diagnosis not present

## 2016-01-29 DIAGNOSIS — H4720 Unspecified optic atrophy: Secondary | ICD-10-CM | POA: Diagnosis not present

## 2016-01-29 DIAGNOSIS — H01001 Unspecified blepharitis right upper eyelid: Secondary | ICD-10-CM | POA: Diagnosis not present

## 2016-04-02 DIAGNOSIS — E038 Other specified hypothyroidism: Secondary | ICD-10-CM | POA: Diagnosis not present

## 2016-04-02 DIAGNOSIS — Z79899 Other long term (current) drug therapy: Secondary | ICD-10-CM | POA: Diagnosis not present

## 2016-04-10 DIAGNOSIS — E038 Other specified hypothyroidism: Secondary | ICD-10-CM | POA: Diagnosis not present

## 2016-04-10 DIAGNOSIS — Z6820 Body mass index (BMI) 20.0-20.9, adult: Secondary | ICD-10-CM | POA: Diagnosis not present

## 2016-04-10 DIAGNOSIS — M1990 Unspecified osteoarthritis, unspecified site: Secondary | ICD-10-CM | POA: Diagnosis not present

## 2016-04-10 DIAGNOSIS — F900 Attention-deficit hyperactivity disorder, predominantly inattentive type: Secondary | ICD-10-CM | POA: Diagnosis not present

## 2016-04-10 DIAGNOSIS — Z0000 Encounter for general adult medical examination without abnormal findings: Secondary | ICD-10-CM | POA: Diagnosis not present

## 2016-04-10 DIAGNOSIS — Z1389 Encounter for screening for other disorder: Secondary | ICD-10-CM | POA: Diagnosis not present

## 2016-04-10 DIAGNOSIS — R030 Elevated blood-pressure reading, without diagnosis of hypertension: Secondary | ICD-10-CM | POA: Diagnosis not present

## 2016-04-10 DIAGNOSIS — N8189 Other female genital prolapse: Secondary | ICD-10-CM | POA: Diagnosis not present

## 2016-06-25 DIAGNOSIS — K045 Chronic apical periodontitis: Secondary | ICD-10-CM | POA: Diagnosis not present

## 2016-06-27 DIAGNOSIS — Z23 Encounter for immunization: Secondary | ICD-10-CM | POA: Diagnosis not present

## 2016-07-26 ENCOUNTER — Emergency Department (HOSPITAL_COMMUNITY): Payer: PPO

## 2016-07-26 ENCOUNTER — Encounter (HOSPITAL_COMMUNITY): Payer: Self-pay | Admitting: Emergency Medicine

## 2016-07-26 ENCOUNTER — Emergency Department (HOSPITAL_COMMUNITY)
Admission: EM | Admit: 2016-07-26 | Discharge: 2016-07-26 | Disposition: A | Discharge status: Home / Self Care | Payer: PPO | Attending: Emergency Medicine | Admitting: Emergency Medicine

## 2016-07-26 DIAGNOSIS — Z7982 Long term (current) use of aspirin: Secondary | ICD-10-CM | POA: Insufficient documentation

## 2016-07-26 DIAGNOSIS — Z79899 Other long term (current) drug therapy: Secondary | ICD-10-CM | POA: Insufficient documentation

## 2016-07-26 DIAGNOSIS — R51 Headache: Secondary | ICD-10-CM | POA: Insufficient documentation

## 2016-07-26 DIAGNOSIS — I10 Essential (primary) hypertension: Secondary | ICD-10-CM | POA: Diagnosis not present

## 2016-07-26 DIAGNOSIS — R040 Epistaxis: Secondary | ICD-10-CM | POA: Insufficient documentation

## 2016-07-26 DIAGNOSIS — Z87891 Personal history of nicotine dependence: Secondary | ICD-10-CM | POA: Diagnosis not present

## 2016-07-26 DIAGNOSIS — R519 Headache, unspecified: Secondary | ICD-10-CM

## 2016-07-26 LAB — BASIC METABOLIC PANEL
Anion gap: 8 (ref 5–15)
BUN: 12 mg/dL (ref 6–20)
CALCIUM: 9.6 mg/dL (ref 8.9–10.3)
CO2: 25 mmol/L (ref 22–32)
Chloride: 99 mmol/L — ABNORMAL LOW (ref 101–111)
Creatinine, Ser: 0.51 mg/dL (ref 0.44–1.00)
GFR calc Af Amer: >60 mL/min (ref >60)
GLUCOSE: 154 mg/dL — AB (ref 65–99)
Potassium: 3.6 mmol/L (ref 3.5–5.1)
Sodium: 132 mmol/L — ABNORMAL LOW (ref 135–145)

## 2016-07-26 LAB — CBC
HEMATOCRIT: 40.8 % (ref 36.0–46.0)
Hemoglobin: 13.5 g/dL (ref 12.0–15.0)
MCH: 31.5 pg (ref 26.0–34.0)
MCHC: 33.1 g/dL (ref 30.0–36.0)
MCV: 95.3 fL (ref 78.0–100.0)
Platelets: 264 10*3/uL (ref 150–400)
RBC: 4.28 MIL/uL (ref 3.87–5.11)
RDW: 12.9 % (ref 11.5–15.5)
WBC: 10.8 10*3/uL — ABNORMAL HIGH (ref 4.0–10.5)

## 2016-07-26 LAB — I-STAT TROPONIN, ED: TROPONIN I, POC: 0.01 ng/mL (ref 0.00–0.08)

## 2016-07-26 MED ORDER — SODIUM CHLORIDE 0.9 % IV BOLUS (SEPSIS)
500.0000 mL | Freq: Once | INTRAVENOUS | Status: AC
  Administered 2016-07-26: 500 mL via INTRAVENOUS

## 2016-07-26 MED ORDER — DIPHENHYDRAMINE HCL 50 MG/ML IJ SOLN: 25.0000 mg | Freq: Once | INTRAMUSCULAR | Status: DC

## 2016-07-26 MED ORDER — DIPHENHYDRAMINE HCL 50 MG/ML IJ SOLN
12.5000 mg | Freq: Once | INTRAMUSCULAR | Status: AC
  Administered 2016-07-26: 12.5 mg via INTRAVENOUS
  Filled 2016-07-26: qty 1

## 2016-07-26 MED ORDER — METOCLOPRAMIDE HCL 5 MG/ML IJ SOLN
10.0000 mg | Freq: Once | INTRAMUSCULAR | Status: AC
  Administered 2016-07-26: 10 mg via INTRAVENOUS
  Filled 2016-07-26: qty 2

## 2016-07-26 NOTE — Discharge Instructions (Signed)
Take tylenol, motrin for headaches.   Recheck blood pressure in a week.   Use afrin as needed for nose bleed for at most 3 days.   See your doctor for follow up  Return to ER if you have worse headaches, vomiting, chest pain, abdominal pain, uncontrolled nose bleeds.

## 2016-07-26 NOTE — ED Provider Notes (Signed)
[facility] DEPT Provider Note   CSN: [id number] Arrival date & time: 07/26/16  1414     History   Chief Complaint Chief Complaint  Patient presents with  . Headache  . Hypertension    HPI Karen Walsh is a 75 y.o. female here presenting with headache, hypertension, vomiting. Patient states that she has sudden onset of left-sided headache around 5 AM when she woke up. She states that this is unusual for her. Associated with some nausea and she tried to drink some fluid and then had an episode of vomiting. Denies any weakness or blurry vision. States that she did have two nosebleeds and as well as some blood and then vomited some blood. Patient was noted to be hypertensive in the ED but has no history of hypertension and has not on any meds for hypertension. Denies hx of headaches or migraines or aneurysms.   The history is provided by the patient.    Past Medical History:  Diagnosis Date  . Arthritis    thumb  . Nephritis    childhood  . Thyroid disease     Patient Active Problem List   Diagnosis Date Noted  . 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 COLONIC POLYPS 08/03/2008  . HEMORRHOIDS, INTERNAL 08/03/2008  . DIVERTICULOSIS, COLON 08/03/2008    Past Surgical History:  Procedure Laterality Date  . TONSILLECTOMY     childhood  . VAGINAL HYSTERECTOMY  1993    OB History    No data available       Home Medications    Prior to Admission medications   Medication Sig Start Date End Date Taking? Authorizing Provider  Ascorbic Acid (VITAMIN C) 1000 MG tablet Take 1,000 mg by mouth 2 (two) times daily.   Yes Historical Provider, MD  aspirin EC 81 MG tablet Take 81 mg by mouth daily.   Yes Historical Provider, MD  Cobalamine Combinations (B12 FOLATE PO) Take 1 capsule by mouth daily.   Yes Historical Provider, MD  Cyanocobalamin (B-12 PO) Take 1 tablet by mouth daily.   Yes Historical Provider, MD  doxylamine, Sleep, (UNISOM) 25 MG tablet Take 8.33 mg by  mouth at bedtime as needed for sleep.    Yes Historical Provider, MD  Nyoka Cowden Tea, Camillia sinensis, (GREEN TEA EXTRACT PO) Take 1 tablet by mouth daily.    Yes Historical Provider, MD  levothyroxine (SYNTHROID, LEVOTHROID) 100 MCG tablet Take 100 mcg by mouth daily before breakfast.   Yes Historical Provider, MD  liothyronine (CYTOMEL) 25 MCG tablet Take 25 mcg by mouth daily. 07/20/16  Yes Historical Provider, MD  MAGNESIUM CITRATE PO Take 1-2 tablets by mouth 2 (two) times daily. 1 TAB IN AM AND 2 TABS IN PM   Yes Historical Provider, MD  Misc Natural Products (TURMERIC CURCUMIN) CAPS Take 1 capsule by mouth daily.   Yes Historical Provider, MD    Family History Family History  Problem Relation Age of Onset  . Colon cancer Neg Hx   . Esophageal cancer Neg Hx   . Rectal cancer Neg Hx   . Stomach cancer Neg Hx     Social History Social History  Substance Use Topics  . Smoking status: Former Smoker    Start date: 10/12/1973  . Smokeless tobacco: Never Used  . Alcohol use 7.0 oz/week    14 drink(s) per week     Allergies   Review of patient's allergies indicates no known allergies.   Review of Systems Review of Systems  Neurological: Positive  for headaches.  All other systems reviewed and are negative.    Physical Exam Updated Vital Signs BP 166/88 (BP Location: Right Arm)   Pulse 81   Temp 98.6 F (37 C) (Oral)   Resp 17   Ht 5\' 2"  (1.575 m)   Wt 110 lb (49.9 kg)   SpO2 100%   BMI 20.12 kg/m   Physical Exam  Constitutional: She is oriented to person, place, and time. She appears well-developed.  Slightly uncomfortable   HENT:  Head: Normocephalic.  Dry blood L anterior nostril, no active bleeding. No bleeding posterior pharynx.   Eyes: Pupils are equal, round, and reactive to light.  Neck: Normal range of motion.  Cardiovascular: Normal rate, regular rhythm and normal heart sounds.   Pulmonary/Chest: Effort normal and breath sounds normal. No respiratory  distress. She has no wheezes.  Abdominal: Soft. Bowel sounds are normal. She exhibits no distension. There is no tenderness. There is no guarding.  Musculoskeletal: Normal range of motion.  Neurological: She is alert and oriented to person, place, and time.  CN 2-12 intact. Nl strength throughout. Nl finger to nose. Nl gait   Skin: Skin is warm.  Psychiatric: She has a normal mood and affect.  Nursing note and vitals reviewed.    ED Treatments / Results  Labs (all labs ordered are listed, but only abnormal results are displayed) Labs Reviewed  BASIC METABOLIC PANEL - Abnormal; Notable for the following:       Result Value   Sodium 132 (*)    Chloride 99 (*)    Glucose, Bld 154 (*)    All other components within normal limits  CBC - Abnormal; Notable for the following:    WBC 10.8 (*)    All other components within normal limits  URINALYSIS, ROUTINE W REFLEX MICROSCOPIC (NOT AT [facility])  Randolm Idol, ED    EKG  EKG Interpretation  Date/Time:  Sunday July 26 2016 16:16:38 EDT Ventricular Rate:  80 PR Interval:    QRS Duration: 91 QT Interval:  398 QTC Calculation: 460 R Axis:   57 Text Interpretation:  Age not entered, assumed to be  75 years old for purpose of ECG interpretation Sinus rhythm Borderline prolonged PR interval Probable left atrial enlargement No previous ECGs available Confirmed by Sherlon Nied  MD, Armstrong Creasy ([id number]) on 07/26/2016 4:28:09 PM       Radiology Ct Head Wo Contrast  Result Date: 07/26/2016 CLINICAL DATA:  75 year old female with headache and hypertension. EXAM: CT HEAD WITHOUT CONTRAST TECHNIQUE: Contiguous axial images were obtained from the base of the skull through the vertex without intravenous contrast. COMPARISON:  None. FINDINGS: Brain: No evidence of acute infarction, hemorrhage, hydrocephalus, extra-axial collection or mass lesion/mass effect. Mild generalized cerebral volume loss noted. Vascular: Mild intracranial atherosclerotic vascular  calcifications noted. Skull: Normal. Negative for fracture or focal lesion. Sinuses/Orbits: No acute finding. Other: None. IMPRESSION: No evidence of acute intracranial abnormality Electronically Signed   By: Margarette Canada M.D.   On: 07/26/2016 15:51    Procedures  Procedures (including critical care time)  Medications Ordered in ED Medications  sodium chloride 0.9 % bolus 500 mL (0 mLs Intravenous Stopped 07/26/16 1700)  metoCLOPramide (REGLAN) injection 10 mg (10 mg Intravenous Given 07/26/16 1635)  diphenhydrAMINE (BENADRYL) injection 12.5 mg (12.5 mg Intravenous Given 07/26/16 1635)     Initial Impression / Assessment and Plan / ED Course  I have reviewed the triage vital signs and the nursing notes.  Pertinent labs &  imaging results that were available during my care of the patient were reviewed by me and considered in my medical decision making (see chart for details).  Clinical Course    Karen Walsh is a 75 y.o. female here with headache, vomiting. Consider SAH and symptoms onset today so if CT head neg, will not need LP. Headaches improved now. Likely bad migraines. Will get labs, CT head, give migraine cocktail and reassess.    5:26 PM Labs unremarkable. CT head nl. Headache resolved after migraine cocktail. BP down to 140/86 on discharge from 183/102 on arrival. Hypertension likely from headaches. Recommend tylenol, motrin for headaches, recheck BP in a week. Has dry blood in L nostril, can try afrin prn.   Final Clinical Impressions(s) / ED Diagnoses   Final diagnoses:  None    New Prescriptions New Prescriptions   No medications on file     Drenda Freeze, MD 07/26/16 1728

## 2016-07-26 NOTE — ED Triage Notes (Signed)
Pt states she woke up this am with a very bad headache and nausea. Pt is vomiting in triage, also states she has had two nose bleeds and pt is vomiting bright red blood at this time. Pt is also hypertensive with no history.

## 2017-01-01 DIAGNOSIS — Z6821 Body mass index (BMI) 21.0-21.9, adult: Secondary | ICD-10-CM | POA: Diagnosis not present

## 2017-01-01 DIAGNOSIS — Z01419 Encounter for gynecological examination (general) (routine) without abnormal findings: Secondary | ICD-10-CM | POA: Diagnosis not present

## 2017-02-01 DIAGNOSIS — T1511XA Foreign body in conjunctival sac, right eye, initial encounter: Secondary | ICD-10-CM | POA: Diagnosis not present

## 2017-02-01 DIAGNOSIS — H04123 Dry eye syndrome of bilateral lacrimal glands: Secondary | ICD-10-CM | POA: Diagnosis not present

## 2017-02-01 DIAGNOSIS — H4720 Unspecified optic atrophy: Secondary | ICD-10-CM | POA: Diagnosis not present

## 2017-02-01 DIAGNOSIS — H524 Presbyopia: Secondary | ICD-10-CM | POA: Diagnosis not present

## 2017-04-19 DIAGNOSIS — M859 Disorder of bone density and structure, unspecified: Secondary | ICD-10-CM | POA: Diagnosis not present

## 2017-04-19 DIAGNOSIS — Z0000 Encounter for general adult medical examination without abnormal findings: Secondary | ICD-10-CM | POA: Diagnosis not present

## 2017-04-19 DIAGNOSIS — E038 Other specified hypothyroidism: Secondary | ICD-10-CM | POA: Diagnosis not present

## 2017-04-26 DIAGNOSIS — R252 Cramp and spasm: Secondary | ICD-10-CM | POA: Diagnosis not present

## 2017-04-26 DIAGNOSIS — K649 Unspecified hemorrhoids: Secondary | ICD-10-CM | POA: Diagnosis not present

## 2017-04-26 DIAGNOSIS — Z0000 Encounter for general adult medical examination without abnormal findings: Secondary | ICD-10-CM | POA: Diagnosis not present

## 2017-04-26 DIAGNOSIS — R030 Elevated blood-pressure reading, without diagnosis of hypertension: Secondary | ICD-10-CM | POA: Diagnosis not present

## 2017-04-26 DIAGNOSIS — N8189 Other female genital prolapse: Secondary | ICD-10-CM | POA: Diagnosis not present

## 2017-04-26 DIAGNOSIS — F900 Attention-deficit hyperactivity disorder, predominantly inattentive type: Secondary | ICD-10-CM | POA: Diagnosis not present

## 2017-04-26 DIAGNOSIS — Z1389 Encounter for screening for other disorder: Secondary | ICD-10-CM | POA: Diagnosis not present

## 2017-04-26 DIAGNOSIS — M1990 Unspecified osteoarthritis, unspecified site: Secondary | ICD-10-CM | POA: Diagnosis not present

## 2017-04-26 DIAGNOSIS — M859 Disorder of bone density and structure, unspecified: Secondary | ICD-10-CM | POA: Diagnosis not present

## 2017-04-26 DIAGNOSIS — Z6821 Body mass index (BMI) 21.0-21.9, adult: Secondary | ICD-10-CM | POA: Diagnosis not present

## 2017-04-26 DIAGNOSIS — E038 Other specified hypothyroidism: Secondary | ICD-10-CM | POA: Diagnosis not present

## 2017-08-10 ENCOUNTER — Encounter: Payer: Self-pay | Admitting: Internal Medicine

## 2017-09-09 DIAGNOSIS — M1611 Unilateral primary osteoarthritis, right hip: Secondary | ICD-10-CM | POA: Diagnosis not present

## 2017-09-09 DIAGNOSIS — M25551 Pain in right hip: Secondary | ICD-10-CM | POA: Diagnosis not present

## 2017-10-01 NOTE — Progress Notes (Signed)
Need orders in epic for 1-15 surgery pre op is 1-10 

## 2017-10-10 NOTE — H&P (Signed)
TOTAL HIP ADMISSION H&P  Patient is admitted for right total hip arthroplasty, anterior approach.  Subjective:  Chief Complaint:   Right hip primary OA / pain  HPI: Karen Walsh, 76 y.o. female, has a history of pain and functional disability in the right hip(s) due to arthritis and patient has failed non-surgical conservative treatments for greater than 12 weeks to include NSAID's and/or analgesics and activity modification.  Onset of symptoms was gradual starting  years ago with gradually worsening course since that time.The patient noted no past surgery on the right hip(s).  Patient currently rates pain in the right hip at 8 out of 10 with activity. Patient has night pain, worsening of pain with activity and weight bearing, trendelenberg gait, pain that interfers with activities of daily living and pain with passive range of motion. Patient has evidence of periarticular osteophytes and joint space narrowing by imaging studies. This condition presents safety issues increasing the risk of falls.   There is no current active infection.  Risks, benefits and expectations were discussed with the patient.  Risks including but not limited to the risk of anesthesia, blood clots, nerve damage, blood vessel damage, failure of the prosthesis, infection and up to and including death.  Patient understand the risks, benefits and expectations and wishes to proceed with surgery.   PCP: Shon Baton, MD  D/C Plans:       Home  Post-op Meds:       No Rx given  Tranexamic Acid:      To be given - IV   Decadron:      Is to be given  FYI:     ASA  Norco  DME:   Rx given for - RW   PT:   No PT    Patient Active Problem List   Diagnosis Date Noted  . 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 COLONIC POLYPS 08/03/2008  . HEMORRHOIDS, INTERNAL 08/03/2008  . DIVERTICULOSIS, COLON 08/03/2008   Past Medical History:  Diagnosis Date  . Arthritis    thumb  . Nephritis    childhood  . Thyroid disease      Past Surgical History:  Procedure Laterality Date  . TONSILLECTOMY     childhood  . VAGINAL HYSTERECTOMY  1993    No current facility-administered medications for this encounter.    Current Outpatient Medications  Medication Sig Dispense Refill Last Dose  . Ascorbic Acid (VITAMIN C) 1000 MG tablet Take 1,000 mg by mouth 2 (two) times daily.   07/26/2016 at Unknown time  . aspirin EC 81 MG tablet Take 81 mg by mouth daily.   07/25/2016 at Unknown time  . Cobalamine Combinations (B12 FOLATE PO) Take 1 capsule by mouth daily.   07/26/2016 at Unknown time  . Cyanocobalamin (B-12 PO) Take 1 tablet by mouth daily.   07/26/2016 at Unknown time  . doxylamine, Sleep, (UNISOM) 25 MG tablet Take 8.33 mg by mouth at bedtime as needed for sleep.    07/25/2016 at Unknown time  . Green Tea, Camillia sinensis, (GREEN TEA EXTRACT PO) Take 1 tablet by mouth daily.    07/26/2016 at Unknown time  . levothyroxine (SYNTHROID, LEVOTHROID) 100 MCG tablet Take 100 mcg by mouth daily before breakfast.   07/26/2016 at Unknown time  . liothyronine (CYTOMEL) 25 MCG tablet Take 25 mcg by mouth daily.  1 07/26/2016 at Unknown time  . MAGNESIUM CITRATE PO Take 1-2 tablets by mouth 2 (two) times daily. 1 TAB IN AM AND 2  TABS IN PM   07/26/2016 at Unknown time  . Misc Natural Products (TURMERIC CURCUMIN) CAPS Take 1 capsule by mouth daily.   07/26/2016 at Unknown time   No Known Allergies   Social History   Tobacco Use  . Smoking status: Former Smoker    Start date: 10/12/1973  . Smokeless tobacco: Never Used  Substance Use Topics  . Alcohol use: Yes    Alcohol/week: 7.0 oz    Types: 14 drink(s) per week    Family History  Problem Relation Age of Onset  . Colon cancer Neg Hx   . Esophageal cancer Neg Hx   . Rectal cancer Neg Hx   . Stomach cancer Neg Hx      Review of Systems  Constitutional: Negative.   HENT: Negative.   Eyes: Negative.        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   Cardiovascular: Negative.   Gastrointestinal: Positive for constipation.  Genitourinary: Negative.   Musculoskeletal: Positive for joint pain.  Skin: Negative.   Neurological: Negative.   Endo/Heme/Allergies: Negative.   Psychiatric/Behavioral: Negative.     Objective:  Physical Exam  Constitutional: She is oriented to person, place, and time. She appears well-developed.  HENT:  Head: Normocephalic.  Eyes: Pupils are equal, round, and reactive to light.  Neck: Neck supple. No JVD present. No tracheal deviation present. No thyromegaly present.  Cardiovascular: Normal rate, regular rhythm and intact distal pulses.  Respiratory: Effort normal and breath sounds normal. No respiratory distress. She has no wheezes.  GI: Soft. There is no tenderness. There is no guarding.  Musculoskeletal:       Right hip: She exhibits decreased range of motion, decreased strength, tenderness and bony tenderness. She exhibits no swelling, no deformity and no laceration.  Lymphadenopathy:    She has no cervical adenopathy.  Neurological: She is alert and oriented to person, place, and time.  Skin: Skin is warm and dry.  Psychiatric: She has a normal mood and affect.     Labs:   Estimated body mass index is 20.12 kg/m as calculated from the following:   Height as of 07/26/16: 5\' 2"  (1.575 m).   Weight as of 07/26/16: 49.9 kg (110 lb).   Imaging Review Plain radiographs demonstrate severe degenerative joint disease of the right hip(s). The bone quality appears to be good for age and reported activity level.  Assessment/Plan:  End stage arthritis, right hip(s)  The patient history, physical examination, clinical judgement of the provider and imaging studies are consistent with end stage degenerative joint disease of the right hip(s) and total hip arthroplasty is deemed medically necessary. The treatment options including medical management, injection therapy, arthroscopy and  arthroplasty were discussed at length. The risks and benefits of total hip arthroplasty were presented and reviewed. The risks due to aseptic loosening, infection, stiffness, dislocation/subluxation,  thromboembolic complications and other imponderables were discussed.  The patient acknowledged the explanation, agreed to proceed with the plan and consent was signed. Patient is being admitted for inpatient treatment for surgery, pain control, PT, OT, prophylactic antibiotics, VTE prophylaxis, progressive ambulation and ADL's and discharge planning.The patient is planning to be discharged home.     [place] Virna Livengood   PA-C  10/10/2017, 2:11 PM

## 2017-10-19 ENCOUNTER — Encounter (HOSPITAL_COMMUNITY): Payer: Self-pay

## 2017-10-19 NOTE — Patient Instructions (Signed)
Your procedure is scheduled on: Tuesday, Jan. 15, 2019   Surgery Time:  2:20PM-3:30PM   Report to [facility] Main  Entrance    Report to admitting at 1150 AM   Call this number if you have problems the morning of surgery [phone]   Do not eat food:After Midnight.   May have clear liquids until 8:00AM the morning of surgery    CLEAR LIQUID DIET   Foods Allowed                                                                     Foods Excluded  Coffee and tea, regular and decaf                             liquids that you cannot  Plain Jell-O in any flavor                                             see through such as: Fruit ices (not with fruit pulp)                                     milk, soups, orange juice  Iced Popsicles                                    All solid food Carbonated beverages, regular and diet                                    Cranberry, grape and apple juices Sports drinks like Gatorade Lightly seasoned clear broth or consume(fat free) Sugar, honey syrup  Sample Menu Breakfast                                Lunch                                     Supper Cranberry juice                    Beef broth                            Chicken broth Jell-O                                     Grape juice                           Apple juice Coffee or tea  Jell-O                                      Popsicle                                                Coffee or tea                        Coffee or tea    Do NOT smoke after Midnight   Take these medicines the morning of surgery with A SIP OF WATER: Levothyroxine, Liothyronine, Use eye drops per normal routine                               You may not have any metal on your body including hair pins, jewelry, and body piercings             Do not wear make-up, lotions, powders, perfumes/cologne, or deodorant             Do not wear nail polish.  Do not shave  48 hours  prior to surgery.                Do not bring valuables to the hospital. [place].   Contacts, dentures or bridgework may not be worn into surgery.   Leave suitcase in the car. After surgery it may be brought to your room.   Special Instructions: Bring a copy of your healthcare power of attorney and living will documents         the day of surgery if you haven't scanned them in before.              Please read over the following fact sheets you were given:   [facility] - Preparing for Surgery Before surgery, you can play an important role.  Because skin is not sterile, your skin needs to be as free of germs as possible.  You can reduce the number of germs on your skin by washing with CHG (chlorahexidine gluconate) soap before surgery.  CHG is an antiseptic cleaner which kills germs and bonds with the skin to continue killing germs even after washing. Please DO NOT use if you have an allergy to CHG or antibacterial soaps.  If your skin becomes reddened/irritated stop using the CHG and inform your nurse when you arrive at Short Stay. Do not shave (including legs and underarms) for at least 48 hours prior to the first CHG shower.  You may shave your face/neck.  Please follow these instructions carefully:  1.  Shower with CHG Soap the night before surgery and the  morning of surgery.  2.  If you choose to wash your hair, wash your hair first as usual with your normal  shampoo.  3.  After you shampoo, rinse your hair and body thoroughly to remove the shampoo.                             4.  Use CHG as you would any other liquid  soap.  You can apply chg directly to the skin and wash.  Gently with a scrungie or clean washcloth.  5.  Apply the CHG Soap to your body ONLY FROM THE NECK DOWN.   Do   not use on face/ open                           Wound or open sores. Avoid contact with eyes, ears mouth and   genitals (private parts).                        Wash face,  Genitals (private parts) with your normal soap.             6.  Wash thoroughly, paying special attention to the area where your    surgery  will be performed.  7.  Thoroughly rinse your body with warm water from the neck down.  8.  DO NOT shower/wash with your normal soap after using and rinsing off the CHG Soap.                9.  Pat yourself dry with a clean towel.            10.  Wear clean pajamas.            11.  Place clean sheets on your bed the night of your first shower and do not  sleep with pets. Day of Surgery : Do not apply any lotions/deodorants the morning of surgery.  Please wear clean clothes to the hospital/surgery center.  FAILURE TO FOLLOW THESE INSTRUCTIONS MAY RESULT IN THE CANCELLATION OF YOUR SURGERY  PATIENT SIGNATURE_________________________________  NURSE SIGNATURE__________________________________  ________________________________________________________________________   Karen Walsh  An incentive spirometer is a tool that can help keep your lungs clear and active. This tool measures how well you are filling your lungs with each breath. Taking long deep breaths may help reverse or decrease the chance of developing breathing (pulmonary) problems (especially infection) following:  A long period of time when you are unable to move or be active. BEFORE THE PROCEDURE   If the spirometer includes an indicator to show your best effort, your nurse or respiratory therapist will set it to a desired goal.  If possible, sit up straight or lean slightly forward. Try not to slouch.  Hold the incentive spirometer in an upright position. INSTRUCTIONS FOR USE  1. Sit on the edge of your bed if possible, or sit up as far as you can in bed or on a chair. 2. Hold the incentive spirometer in an upright position. 3. Breathe out normally. 4. Place the mouthpiece in your mouth and seal your lips tightly around it. 5. Breathe in slowly and as deeply  as possible, raising the piston or the ball toward the top of the column. 6. Hold your breath for 3-5 seconds or for as long as possible. Allow the piston or ball to fall to the bottom of the column. 7. Remove the mouthpiece from your mouth and breathe out normally. 8. Rest for a few seconds and repeat Steps 1 through 7 at least 10 times every 1-2 hours when you are awake. Take your time and take a few normal breaths between deep breaths. 9. The spirometer may include an indicator to show your best effort. Use the indicator as a goal to work toward during each repetition. 10. After each set of 10 deep breaths, practice  coughing to be sure your lungs are clear. If you have an incision (the cut made at the time of surgery), support your incision when coughing by placing a pillow or rolled up towels firmly against it. Once you are able to get out of bed, walk around indoors and cough well. You may stop using the incentive spirometer when instructed by your caregiver.  RISKS AND COMPLICATIONS  Take your time so you do not get dizzy or light-headed.  If you are in pain, you may need to take or ask for pain medication before doing incentive spirometry. It is harder to take a deep breath if you are having pain. AFTER USE  Rest and breathe slowly and easily.  It can be helpful to keep track of a log of your progress. Your caregiver can provide you with a simple table to help with this. If you are using the spirometer at home, follow these instructions: [place] IF:   You are having difficultly using the spirometer.  You have trouble using the spirometer as often as instructed.  Your pain medication is not giving enough relief while using the spirometer.  You develop fever of 100.5 F (38.1 C) or higher. SEEK IMMEDIATE MEDICAL CARE IF:   You cough up bloody sputum that had not been present before.  You develop fever of 102 F (38.9 C) or greater.  You develop worsening pain at or  near the incision site. MAKE SURE YOU:   Understand these instructions.  Will watch your condition.  Will get help right away if you are not doing well or get worse. Document Released: 02/08/2007 Document Revised: 12/21/2011 Document Reviewed: 04/11/2007 Exit[employer] Patient Information 2014 [employer], [place].   ________________________________________________________________________  WHAT IS A BLOOD TRANSFUSION? Blood Transfusion Information  A transfusion is the replacement of blood or some of its parts. Blood is made up of multiple cells which provide different functions.  Red blood cells carry oxygen and are used for blood loss replacement.  White blood cells fight against infection.  Platelets control bleeding.  Plasma helps clot blood.  Other blood products are available for specialized needs, such as hemophilia or other clotting disorders. BEFORE THE TRANSFUSION  Who gives blood for transfusions?   Healthy volunteers who are fully evaluated to make sure their blood is safe. This is blood bank blood. Transfusion therapy is the safest it has ever been in the practice of medicine. Before blood is taken from a donor, a complete history is taken to make sure that person has no history of diseases nor engages in risky social behavior (examples are intravenous drug use or sexual activity with multiple partners). The donor's travel history is screened to minimize risk of transmitting infections, such as malaria. The donated blood is tested for signs of infectious diseases, such as HIV and hepatitis. The blood is then tested to be sure it is compatible with you in order to minimize the chance of a transfusion reaction. If you or a relative donates blood, this is often done in anticipation of surgery and is not appropriate for emergency situations. It takes many days to process the donated blood. RISKS AND COMPLICATIONS Although transfusion therapy is very safe and saves many lives, the main  dangers of transfusion include:   Getting an infectious disease.  Developing a transfusion reaction. This is an allergic reaction to something in the blood you were given. Every precaution is taken to prevent this. The decision to have a blood transfusion has been considered  carefully by your caregiver before blood is given. Blood is not given unless the benefits outweigh the risks. AFTER THE TRANSFUSION  Right after receiving a blood transfusion, you will usually feel much better and more energetic. This is especially true if your red blood cells have gotten low (anemic). The transfusion raises the level of the red blood cells which carry oxygen, and this usually causes an energy increase.  The nurse administering the transfusion will monitor you carefully for complications. HOME CARE INSTRUCTIONS  No special instructions are needed after a transfusion. You may find your energy is better. Speak with your caregiver about any limitations on activity for underlying diseases you may have. SEEK MEDICAL CARE IF:   Your condition is not improving after your transfusion.  You develop redness or irritation at the intravenous (IV) site. SEEK IMMEDIATE MEDICAL CARE IF:  Any of the following symptoms occur over the next 12 hours:  Shaking chills.  You have a temperature by mouth above 102 F (38.9 C), not controlled by medicine.  Chest, back, or muscle pain.  People around you feel you are not acting correctly or are confused.  Shortness of breath or difficulty breathing.  Dizziness and fainting.  You get a rash or develop hives.  You have a decrease in urine output.  Your urine turns a dark color or changes to pink, red, or brown. Any of the following symptoms occur over the next 10 days:  You have a temperature by mouth above 102 F (38.9 C), not controlled by medicine.  Shortness of breath.  Weakness after normal activity.  The white part of the eye turns yellow  (jaundice).  You have a decrease in the amount of urine or are urinating less often.  Your urine turns a dark color or changes to pink, red, or brown. Document Released: 09/25/2000 Document Revised: 12/21/2011 Document Reviewed: 05/14/2008 [facility] Patient Information 2014 [place], [place].  _______________________________________________________________________

## 2017-10-21 ENCOUNTER — Encounter (HOSPITAL_COMMUNITY)
Admission: RE | Admit: 2017-10-21 | Discharge: 2017-10-21 | Disposition: A | Discharge status: Home / Self Care | Payer: PPO | Source: Ambulatory Visit | Attending: Orthopedic Surgery | Admitting: Orthopedic Surgery

## 2017-10-21 ENCOUNTER — Encounter (HOSPITAL_COMMUNITY): Payer: Self-pay

## 2017-10-21 ENCOUNTER — Other Ambulatory Visit: Payer: Self-pay

## 2017-10-21 DIAGNOSIS — M1611 Unilateral primary osteoarthritis, right hip: Secondary | ICD-10-CM | POA: Insufficient documentation

## 2017-10-21 DIAGNOSIS — Z01818 Encounter for other preprocedural examination: Secondary | ICD-10-CM | POA: Diagnosis not present

## 2017-10-21 HISTORY — DX: Other specified postprocedural states: Z98.890

## 2017-10-21 HISTORY — DX: Unspecified disturbances of skin sensation: R20.9

## 2017-10-21 HISTORY — DX: Other hemorrhoids: K64.8

## 2017-10-21 HISTORY — DX: Hypothyroidism, unspecified: E03.9

## 2017-10-21 HISTORY — DX: Anemia, unspecified: D64.9

## 2017-10-21 HISTORY — DX: Polyp of colon: K63.5

## 2017-10-21 HISTORY — DX: Diverticulosis of intestine, part unspecified, without perforation or abscess without bleeding: K57.90

## 2017-10-21 HISTORY — DX: Other specified postprocedural states: R11.2

## 2017-10-21 LAB — SURGICAL PCR SCREEN
MRSA, PCR: NEGATIVE
STAPHYLOCOCCUS AUREUS: NEGATIVE

## 2017-10-21 LAB — CBC
HEMATOCRIT: 39.9 % (ref 36.0–46.0)
HEMOGLOBIN: 13 g/dL (ref 12.0–15.0)
MCH: 31.3 pg (ref 26.0–34.0)
MCHC: 32.6 g/dL (ref 30.0–36.0)
MCV: 96.1 fL (ref 78.0–100.0)
Platelets: 239 10*3/uL (ref 150–400)
RBC: 4.15 MIL/uL (ref 3.87–5.11)
RDW: 13.4 % (ref 11.5–15.5)
WBC: 7.6 10*3/uL (ref 4.0–10.5)

## 2017-10-21 LAB — ABO/RH: ABO/RH(D): A POS

## 2017-10-22 DIAGNOSIS — M1611 Unilateral primary osteoarthritis, right hip: Secondary | ICD-10-CM | POA: Diagnosis not present

## 2017-10-25 MED ORDER — TRANEXAMIC ACID 1000 MG/10ML IV SOLN
1000.0000 mg | INTRAVENOUS | Status: DC
  Filled 2017-10-25: qty 10

## 2017-10-26 ENCOUNTER — Inpatient Hospital Stay (HOSPITAL_COMMUNITY)
Admission: RE | Admit: 2017-10-26 | Discharge: 2017-10-27 | DRG: 470 | Disposition: A | Discharge status: Home / Self Care | Payer: PPO | Source: Ambulatory Visit | Attending: Orthopedic Surgery | Admitting: Orthopedic Surgery

## 2017-10-26 ENCOUNTER — Inpatient Hospital Stay (HOSPITAL_COMMUNITY): Payer: PPO

## 2017-10-26 ENCOUNTER — Encounter (HOSPITAL_COMMUNITY): Payer: Self-pay | Admitting: Emergency Medicine

## 2017-10-26 ENCOUNTER — Inpatient Hospital Stay (HOSPITAL_COMMUNITY): Payer: PPO | Admitting: Anesthesiology

## 2017-10-26 ENCOUNTER — Other Ambulatory Visit: Payer: Self-pay

## 2017-10-26 ENCOUNTER — Encounter (HOSPITAL_COMMUNITY)
Admission: RE | Disposition: A | Discharge status: Home / Self Care | Payer: Self-pay | Source: Ambulatory Visit | Attending: Orthopedic Surgery

## 2017-10-26 DIAGNOSIS — Z96641 Presence of right artificial hip joint: Secondary | ICD-10-CM | POA: Diagnosis not present

## 2017-10-26 DIAGNOSIS — E039 Hypothyroidism, unspecified: Secondary | ICD-10-CM | POA: Diagnosis present

## 2017-10-26 DIAGNOSIS — K5730 Diverticulosis of large intestine without perforation or abscess without bleeding: Secondary | ICD-10-CM | POA: Diagnosis not present

## 2017-10-26 DIAGNOSIS — Z79890 Hormone replacement therapy (postmenopausal): Secondary | ICD-10-CM | POA: Diagnosis not present

## 2017-10-26 DIAGNOSIS — K5900 Constipation, unspecified: Secondary | ICD-10-CM | POA: Diagnosis present

## 2017-10-26 DIAGNOSIS — Z90710 Acquired absence of both cervix and uterus: Secondary | ICD-10-CM | POA: Diagnosis not present

## 2017-10-26 DIAGNOSIS — M1611 Unilateral primary osteoarthritis, right hip: Principal | ICD-10-CM | POA: Diagnosis present

## 2017-10-26 DIAGNOSIS — Z7982 Long term (current) use of aspirin: Secondary | ICD-10-CM

## 2017-10-26 DIAGNOSIS — K648 Other hemorrhoids: Secondary | ICD-10-CM | POA: Diagnosis not present

## 2017-10-26 DIAGNOSIS — K635 Polyp of colon: Secondary | ICD-10-CM | POA: Diagnosis not present

## 2017-10-26 DIAGNOSIS — Z96649 Presence of unspecified artificial hip joint: Secondary | ICD-10-CM

## 2017-10-26 DIAGNOSIS — Z87891 Personal history of nicotine dependence: Secondary | ICD-10-CM | POA: Diagnosis not present

## 2017-10-26 DIAGNOSIS — Z471 Aftercare following joint replacement surgery: Secondary | ICD-10-CM | POA: Diagnosis not present

## 2017-10-26 HISTORY — PX: TOTAL HIP ARTHROPLASTY: SHX124

## 2017-10-26 HISTORY — DX: Other bacterial infections of unspecified site: A49.8

## 2017-10-26 LAB — TYPE AND SCREEN
ABO/RH(D): A POS
Antibody Screen: NEGATIVE

## 2017-10-26 SURGERY — ARTHROPLASTY, HIP, TOTAL, ANTERIOR APPROACH
Anesthesia: Spinal | Site: Hip | Laterality: Right

## 2017-10-26 MED ORDER — POLYETHYLENE GLYCOL 3350 17 G PO PACK
17.0000 g | PACK | Freq: Two times a day (BID) | ORAL | Status: DC
  Administered 2017-10-27: 10:00:00 17 g via ORAL
  Filled 2017-10-26: qty 1

## 2017-10-26 MED ORDER — PROPOFOL 10 MG/ML IV BOLUS
INTRAVENOUS | Status: AC
  Filled 2017-10-26: qty 60

## 2017-10-26 MED ORDER — PROPOFOL 500 MG/50ML IV EMUL
INTRAVENOUS | Status: DC | PRN
  Administered 2017-10-26: 50 ug/kg/min via INTRAVENOUS

## 2017-10-26 MED ORDER — CELECOXIB 200 MG PO CAPS
200.0000 mg | ORAL_CAPSULE | Freq: Two times a day (BID) | ORAL | Status: DC
  Administered 2017-10-27: 200 mg via ORAL
  Filled 2017-10-26: qty 1

## 2017-10-26 MED ORDER — DOCUSATE SODIUM 100 MG PO CAPS: 100.0000 mg | ORAL_CAPSULE | Freq: Two times a day (BID) | ORAL | 0 refills | Status: AC

## 2017-10-26 MED ORDER — DIPHENHYDRAMINE HCL 12.5 MG/5ML PO ELIX: 12.5000 mg | ORAL_SOLUTION | ORAL | Status: DC | PRN

## 2017-10-26 MED ORDER — POLYETHYLENE GLYCOL 3350 17 G PO PACK: 17.0000 g | PACK | Freq: Two times a day (BID) | ORAL | 0 refills | Status: DC

## 2017-10-26 MED ORDER — PHENOL 1.4 % MT LIQD
1.0000 | OROMUCOSAL | Status: DC | PRN
Start: 2017-10-26 — End: 2017-10-27
  Filled 2017-10-26: qty 177

## 2017-10-26 MED ORDER — MIDAZOLAM HCL 5 MG/5ML IJ SOLN
INTRAMUSCULAR | Status: DC | PRN
  Administered 2017-10-26: 1 mg via INTRAVENOUS

## 2017-10-26 MED ORDER — CHLORHEXIDINE GLUCONATE 4 % EX LIQD: 60.0000 mL | Freq: Once | CUTANEOUS | Status: DC

## 2017-10-26 MED ORDER — DEXTROSE 5 % IV SOLN
500.0000 mg | Freq: Four times a day (QID) | INTRAVENOUS | Status: DC | PRN
  Administered 2017-10-26: 18:00:00 500 mg via INTRAVENOUS
  Filled 2017-10-26: qty 550

## 2017-10-26 MED ORDER — DOCUSATE SODIUM 100 MG PO CAPS
100.0000 mg | ORAL_CAPSULE | Freq: Two times a day (BID) | ORAL | Status: DC
  Administered 2017-10-27: 100 mg via ORAL
  Filled 2017-10-26: qty 1

## 2017-10-26 MED ORDER — FENTANYL CITRATE (PF) 100 MCG/2ML IJ SOLN
INTRAMUSCULAR | Status: DC | PRN
  Administered 2017-10-26: 100 ug via INTRAVENOUS

## 2017-10-26 MED ORDER — PHENYLEPHRINE HCL 10 MG/ML IJ SOLN
INTRAMUSCULAR | Status: DC | PRN
  Administered 2017-10-26: 40 ug via INTRAVENOUS

## 2017-10-26 MED ORDER — PHENYLEPHRINE 40 MCG/ML (10ML) SYRINGE FOR IV PUSH (FOR BLOOD PRESSURE SUPPORT)
PREFILLED_SYRINGE | INTRAVENOUS | Status: AC
  Filled 2017-10-26: qty 10

## 2017-10-26 MED ORDER — HYDROCODONE-ACETAMINOPHEN 7.5-325 MG PO TABS
2.0000 | ORAL_TABLET | ORAL | Status: DC | PRN
  Administered 2017-10-26 – 2017-10-27 (×2): 2 via ORAL
  Filled 2017-10-26 (×2): qty 2

## 2017-10-26 MED ORDER — ASPIRIN 81 MG PO CHEW: 81.0000 mg | CHEWABLE_TABLET | Freq: Two times a day (BID) | ORAL | 0 refills | Status: AC

## 2017-10-26 MED ORDER — CEFAZOLIN SODIUM-DEXTROSE 2-4 GM/100ML-% IV SOLN
2.0000 g | INTRAVENOUS | Status: AC
  Administered 2017-10-26: 2 g via INTRAVENOUS
  Filled 2017-10-26: qty 100

## 2017-10-26 MED ORDER — BISACODYL 10 MG RE SUPP: 10.0000 mg | Freq: Every day | RECTAL | Status: DC | PRN

## 2017-10-26 MED ORDER — ONDANSETRON HCL 4 MG/2ML IJ SOLN
INTRAMUSCULAR | Status: DC | PRN
  Administered 2017-10-26: 4 mg via INTRAVENOUS

## 2017-10-26 MED ORDER — HYDROCODONE-ACETAMINOPHEN 7.5-325 MG PO TABS
1.0000 | ORAL_TABLET | ORAL | Status: DC | PRN
  Administered 2017-10-26: 1 via ORAL
  Filled 2017-10-26: qty 1

## 2017-10-26 MED ORDER — LIOTHYRONINE SODIUM 25 MCG PO TABS
25.0000 ug | ORAL_TABLET | Freq: Every day | ORAL | Status: DC
  Administered 2017-10-27: 10:00:00 25 ug via ORAL
  Filled 2017-10-26: qty 1

## 2017-10-26 MED ORDER — METOCLOPRAMIDE HCL 5 MG/ML IJ SOLN: 5.0000 mg | Freq: Three times a day (TID) | INTRAMUSCULAR | Status: DC | PRN

## 2017-10-26 MED ORDER — ONDANSETRON HCL 4 MG/2ML IJ SOLN: 4.0000 mg | Freq: Once | INTRAMUSCULAR | Status: DC | PRN

## 2017-10-26 MED ORDER — LEVOTHYROXINE SODIUM 100 MCG PO TABS
100.0000 ug | ORAL_TABLET | Freq: Every day | ORAL | Status: DC
  Administered 2017-10-27: 100 ug via ORAL
  Filled 2017-10-26: qty 1

## 2017-10-26 MED ORDER — ALUM & MAG HYDROXIDE-SIMETH 200-200-20 MG/5ML PO SUSP: 15.0000 mL | ORAL | Status: DC | PRN

## 2017-10-26 MED ORDER — ONDANSETRON HCL 4 MG PO TABS: 4.0000 mg | ORAL_TABLET | Freq: Four times a day (QID) | ORAL | Status: DC | PRN

## 2017-10-26 MED ORDER — MENTHOL 3 MG MT LOZG: 1.0000 | LOZENGE | OROMUCOSAL | Status: DC | PRN

## 2017-10-26 MED ORDER — MAGNESIUM CITRATE PO SOLN: 1.0000 | Freq: Once | ORAL | Status: DC | PRN

## 2017-10-26 MED ORDER — METOCLOPRAMIDE HCL 5 MG PO TABS: 5.0000 mg | ORAL_TABLET | Freq: Three times a day (TID) | ORAL | Status: DC | PRN

## 2017-10-26 MED ORDER — SODIUM CHLORIDE 0.9 % IV SOLN
INTRAVENOUS | Status: DC
  Administered 2017-10-26: 18:00:00 via INTRAVENOUS

## 2017-10-26 MED ORDER — BUPIVACAINE IN DEXTROSE 0.75-8.25 % IT SOLN
INTRATHECAL | Status: DC | PRN
  Administered 2017-10-26: 2 mL via INTRATHECAL

## 2017-10-26 MED ORDER — PHENYLEPHRINE HCL 10 MG/ML IJ SOLN
INTRAVENOUS | Status: DC | PRN
  Administered 2017-10-26: 25 ug/min via INTRAVENOUS

## 2017-10-26 MED ORDER — MIDAZOLAM HCL 2 MG/2ML IJ SOLN
INTRAMUSCULAR | Status: AC
  Filled 2017-10-26: qty 2

## 2017-10-26 MED ORDER — FERROUS SULFATE 325 (65 FE) MG PO TABS: 325.0000 mg | ORAL_TABLET | Freq: Three times a day (TID) | ORAL | 3 refills | Status: DC

## 2017-10-26 MED ORDER — HYDROMORPHONE HCL 1 MG/ML IJ SOLN: 0.5000 mg | INTRAMUSCULAR | Status: DC | PRN

## 2017-10-26 MED ORDER — CEFAZOLIN SODIUM-DEXTROSE 2-4 GM/100ML-% IV SOLN
2.0000 g | Freq: Four times a day (QID) | INTRAVENOUS | Status: AC
  Administered 2017-10-26 – 2017-10-27 (×2): 2 g via INTRAVENOUS
  Filled 2017-10-26 (×2): qty 100

## 2017-10-26 MED ORDER — METHOCARBAMOL 500 MG PO TABS: 500.0000 mg | ORAL_TABLET | Freq: Four times a day (QID) | ORAL | Status: DC | PRN

## 2017-10-26 MED ORDER — ACETAMINOPHEN 325 MG PO TABS: 650.0000 mg | ORAL_TABLET | ORAL | Status: DC | PRN

## 2017-10-26 MED ORDER — HYDROCODONE-ACETAMINOPHEN 7.5-325 MG PO TABS: 1.0000 | ORAL_TABLET | ORAL | 0 refills | Status: DC | PRN

## 2017-10-26 MED ORDER — TRANEXAMIC ACID 1000 MG/10ML IV SOLN
1000.0000 mg | Freq: Once | INTRAVENOUS | Status: AC
  Administered 2017-10-26: 1000 mg via INTRAVENOUS
  Filled 2017-10-26: qty 1100

## 2017-10-26 MED ORDER — PROPOFOL 10 MG/ML IV BOLUS
INTRAVENOUS | Status: AC
  Filled 2017-10-26: qty 20

## 2017-10-26 MED ORDER — PHENYLEPHRINE HCL 10 MG/ML IJ SOLN
INTRAMUSCULAR | Status: AC
  Filled 2017-10-26: qty 1

## 2017-10-26 MED ORDER — PROMETHAZINE HCL 25 MG RE SUPP
25.0000 mg | Freq: Four times a day (QID) | RECTAL | Status: DC | PRN
  Filled 2017-10-26: qty 1

## 2017-10-26 MED ORDER — DEXAMETHASONE SODIUM PHOSPHATE 10 MG/ML IJ SOLN
10.0000 mg | Freq: Once | INTRAMUSCULAR | Status: AC
  Administered 2017-10-27: 12:00:00 10 mg via INTRAVENOUS
  Filled 2017-10-26: qty 1

## 2017-10-26 MED ORDER — METHOCARBAMOL 500 MG PO TABS: 500.0000 mg | ORAL_TABLET | Freq: Four times a day (QID) | ORAL | 0 refills | Status: AC | PRN

## 2017-10-26 MED ORDER — DEXAMETHASONE SODIUM PHOSPHATE 10 MG/ML IJ SOLN
INTRAMUSCULAR | Status: AC
  Filled 2017-10-26: qty 1

## 2017-10-26 MED ORDER — FERROUS SULFATE 325 (65 FE) MG PO TABS
325.0000 mg | ORAL_TABLET | Freq: Three times a day (TID) | ORAL | Status: DC
  Administered 2017-10-27: 10:00:00 325 mg via ORAL
  Filled 2017-10-26: qty 1

## 2017-10-26 MED ORDER — FENTANYL CITRATE (PF) 100 MCG/2ML IJ SOLN: 25.0000 ug | INTRAMUSCULAR | Status: DC | PRN

## 2017-10-26 MED ORDER — LACTATED RINGERS IV SOLN
INTRAVENOUS | Status: DC
  Administered 2017-10-26 (×2): via INTRAVENOUS

## 2017-10-26 MED ORDER — PROMETHAZINE HCL 25 MG PO TABS: 12.5000 mg | ORAL_TABLET | Freq: Four times a day (QID) | ORAL | Status: DC | PRN

## 2017-10-26 MED ORDER — ONDANSETRON HCL 4 MG/2ML IJ SOLN
INTRAMUSCULAR | Status: AC
  Filled 2017-10-26: qty 2

## 2017-10-26 MED ORDER — ONDANSETRON HCL 4 MG/2ML IJ SOLN
4.0000 mg | Freq: Four times a day (QID) | INTRAMUSCULAR | Status: DC | PRN
  Administered 2017-10-26: 18:00:00 4 mg via INTRAVENOUS
  Filled 2017-10-26: qty 2

## 2017-10-26 MED ORDER — TRANEXAMIC ACID 1000 MG/10ML IV SOLN
1000.0000 mg | INTRAVENOUS | Status: AC
  Administered 2017-10-26: 1000 mg via INTRAVENOUS
  Filled 2017-10-26: qty 10

## 2017-10-26 MED ORDER — SODIUM CHLORIDE 0.9 % IR SOLN
Status: DC | PRN
  Administered 2017-10-26: 1000 mL

## 2017-10-26 MED ORDER — ACETAMINOPHEN 650 MG RE SUPP: 650.0000 mg | RECTAL | Status: DC | PRN

## 2017-10-26 MED ORDER — PROMETHAZINE HCL 25 MG/ML IJ SOLN
12.5000 mg | Freq: Four times a day (QID) | INTRAMUSCULAR | Status: DC | PRN
  Administered 2017-10-26: 12.5 mg via INTRAVENOUS
  Filled 2017-10-26 (×2): qty 1

## 2017-10-26 MED ORDER — DEXAMETHASONE SODIUM PHOSPHATE 10 MG/ML IJ SOLN
10.0000 mg | Freq: Once | INTRAMUSCULAR | Status: AC
  Administered 2017-10-26: 10 mg via INTRAVENOUS

## 2017-10-26 MED ORDER — ASPIRIN 81 MG PO CHEW
81.0000 mg | CHEWABLE_TABLET | Freq: Two times a day (BID) | ORAL | Status: DC
  Administered 2017-10-26 – 2017-10-27 (×2): 81 mg via ORAL
  Filled 2017-10-26 (×2): qty 1

## 2017-10-26 MED ORDER — FENTANYL CITRATE (PF) 100 MCG/2ML IJ SOLN
INTRAMUSCULAR | Status: AC
  Filled 2017-10-26: qty 2

## 2017-10-26 MED ORDER — PROPOFOL 10 MG/ML IV BOLUS
INTRAVENOUS | Status: DC | PRN
  Administered 2017-10-26: 40 mg via INTRAVENOUS

## 2017-10-26 MED ORDER — STERILE WATER FOR IRRIGATION IR SOLN
Status: DC | PRN
  Administered 2017-10-26: 2000 mL

## 2017-10-26 SURGICAL SUPPLY — 40 items
ADH SKN CLS APL DERMABOND .7 (GAUZE/BANDAGES/DRESSINGS) ×1
BAG DECANTER FOR FLEXI CONT (MISCELLANEOUS) IMPLANT
BAG SPEC THK2 15X12 ZIP CLS (MISCELLANEOUS)
BAG URINE DRAINAGE (UROLOGICAL SUPPLIES) ×2 IMPLANT
BAG ZIPLOCK 12X15 (MISCELLANEOUS) IMPLANT
BLADE SAG 18X100X1.27 (BLADE) ×3 IMPLANT
CAPT HIP TOTAL 2 ×2 IMPLANT
CLOTH BEACON ORANGE TIMEOUT ST (SAFETY) ×3 IMPLANT
COVER PERINEAL POST (MISCELLANEOUS) ×3 IMPLANT
COVER SURGICAL LIGHT HANDLE (MISCELLANEOUS) ×3 IMPLANT
DERMABOND ADVANCED (GAUZE/BANDAGES/DRESSINGS) ×2
DERMABOND ADVANCED .7 DNX12 (GAUZE/BANDAGES/DRESSINGS) ×1 IMPLANT
DRAPE STERI IOBAN 125X83 (DRAPES) ×3 IMPLANT
DRAPE U-SHAPE 47X51 STRL (DRAPES) ×6 IMPLANT
DRESSING AQUACEL AG SP 3.5X10 (GAUZE/BANDAGES/DRESSINGS) ×1 IMPLANT
DRSG AQUACEL AG SP 3.5X10 (GAUZE/BANDAGES/DRESSINGS) ×3
DURAPREP 26ML APPLICATOR (WOUND CARE) ×3 IMPLANT
ELECT REM PT RETURN 15FT ADLT (MISCELLANEOUS) ×3 IMPLANT
GLOVE BIOGEL M STRL SZ7.5 (GLOVE) ×2 IMPLANT
GLOVE BIOGEL PI IND STRL 7.5 (GLOVE) ×1 IMPLANT
GLOVE BIOGEL PI IND STRL 8.5 (GLOVE) ×1 IMPLANT
GLOVE BIOGEL PI INDICATOR 7.5 (GLOVE) ×4
GLOVE BIOGEL PI INDICATOR 8.5 (GLOVE)
GLOVE ECLIPSE 8.0 STRL XLNG CF (GLOVE) ×2 IMPLANT
GLOVE ORTHO TXT STRL SZ7.5 (GLOVE) ×5 IMPLANT
GOWN STRL REUS W/TWL LRG LVL3 (GOWN DISPOSABLE) ×3 IMPLANT
GOWN STRL REUS W/TWL XL LVL3 (GOWN DISPOSABLE) ×3 IMPLANT
HOLDER FOLEY CATH W/STRAP (MISCELLANEOUS) ×3 IMPLANT
PACK ANTERIOR HIP CUSTOM (KITS) ×3 IMPLANT
SUT MNCRL AB 4-0 PS2 18 (SUTURE) ×3 IMPLANT
SUT STRATAFIX 1PDS 45CM VIOLET (SUTURE) ×1 IMPLANT
SUT STRATAFIX SPIRAL PDS+ 70CM (SUTURE)
SUT VIC AB 1 CT1 36 (SUTURE) ×9 IMPLANT
SUT VIC AB 2-0 CT1 27 (SUTURE) ×6
SUT VIC AB 2-0 CT1 TAPERPNT 27 (SUTURE) ×2 IMPLANT
SUTURE STRATFX SPIRL PDS+ 70CM (SUTURE) ×1 IMPLANT
TRAY FOLEY CATH 14FRSI W/METER (CATHETERS) ×2 IMPLANT
TRAY FOLEY W/METER SILVER 16FR (SET/KITS/TRAYS/PACK) IMPLANT
WATER STERILE IRR 1000ML POUR (IV SOLUTION) ×3 IMPLANT
YANKAUER SUCT BULB TIP 10FT TU (MISCELLANEOUS) ×2 IMPLANT

## 2017-10-26 NOTE — Discharge Instructions (Signed)

## 2017-10-26 NOTE — Anesthesia Preprocedure Evaluation (Addendum)
Anesthesia Evaluation  Patient identified by MRN, date of birth, ID band Patient awake    Reviewed: Allergy & Precautions, NPO status , Patient's Chart, lab work & pertinent test results  History of Anesthesia Complications (+) PONV and history of anesthetic complications  Airway Mallampati: II  TM Distance: >3 FB Neck ROM: Full    Dental  (+) Teeth Intact, Dental Advisory Given, Caps,    Pulmonary former smoker,    Pulmonary exam normal breath sounds clear to auscultation       Cardiovascular Exercise Tolerance: Good negative cardio ROS Normal cardiovascular exam Rhythm:Regular Rate:Normal     Neuro/Psych negative neurological ROS  negative psych ROS   GI/Hepatic negative GI ROS, Neg liver ROS,   Endo/Other  Hypothyroidism   Renal/GU negative Renal ROS     Musculoskeletal  (+) Arthritis , Osteoarthritis,    Abdominal   Peds  Hematology negative hematology ROS (+) Plt 239k   Anesthesia Other Findings Day of surgery medications reviewed with the patient.  Reproductive/Obstetrics                           Anesthesia Physical Anesthesia Plan  ASA: II  Anesthesia Plan: Spinal and MAC   Post-op Pain Management:    Induction:   PONV Risk Score and Plan: 3 and Propofol infusion, Ondansetron and Treatment may vary due to age or medical condition  Airway Management Planned: Simple Face Mask  Additional Equipment:   Intra-op Plan:   Post-operative Plan:   Informed Consent: I have reviewed the patients History and Physical, chart, labs and discussed the procedure including the risks, benefits and alternatives for the proposed anesthesia with the patient or authorized representative who has indicated his/her understanding and acceptance.   Dental advisory given  Plan Discussed with: CRNA, Anesthesiologist and Surgeon  Anesthesia Plan Comments: (Discussed risks and benefits of and  differences between spinal and general. Discussed risks of spinal including headache, backache, failure, bleeding, infection, and nerve damage. Patient consents to spinal. Questions answered. Coagulation studies and platelet count acceptable.)        Anesthesia Quick Evaluation

## 2017-10-26 NOTE — Op Note (Signed)
NAME:  Karen Walsh                ACCOUNT NO.: [id number]      MEDICAL RECORD NO.: [id number]      FACILITY:  [facility]      PHYSICIAN:  Mauri Pole  DATE OF BIRTH:  [birth year]     DATE OF PROCEDURE:  10/26/2017                                 OPERATIVE REPORT         PREOPERATIVE DIAGNOSIS: Right  hip osteoarthritis.      POSTOPERATIVE DIAGNOSIS:  Right hip osteoarthritis.      PROCEDURE:  Right total hip replacement through an anterior approach   utilizing DePuy THR system, component size 10mm pinnacle cup, a size 32+4 neutral   Altrex liner, a size 2 Hi Tri Lock stem with a 32+1 delta ceramic   ball.      SURGEON:  Pietro Cassis. Alvan Dame, M.D.      ASSISTANT:  Nehemiah Massed, PA-C     ANESTHESIA:  Spinal.      SPECIMENS:  None.      COMPLICATIONS:  None.      BLOOD LOSS:  200 cc     DRAINS:  None.      INDICATION OF THE PROCEDURE:  Karen Walsh is a 77 y.o. female who had   presented to office for evaluation of right hip pain.  Radiographs revealed   progressive degenerative changes with bone-on-bone   articulation to the  hip joint.  The patient had painful limited range of   motion significantly affecting their overall quality of life.  The patient was failing to    respond to conservative measures, and at this point was ready   to proceed with more definitive measures.  The patient has noted progressive   degenerative changes in his hip, progressive problems and dysfunction   with regarding the hip prior to surgery.  Consent was obtained for   benefit of pain relief.  Specific risk of infection, DVT, component   failure, dislocation, need for revision surgery, as well discussion of   the anterior versus posterior approach were reviewed.  Consent was   obtained for benefit of anterior pain relief through an anterior   approach.      PROCEDURE IN DETAIL:  The patient was brought to operative theater.   Once adequate anesthesia, preoperative  antibiotics, 2 gm of Ancef, 1 gm of Tranexamic Acid, and 10 mg of Decadron administered.   The patient was positioned supine on the OSI Hanna table.  Once adequate   padding of boney process was carried out, we had predraped out the hip, and  used fluoroscopy to confirm orientation of the pelvis and position.      The right hip was then prepped and draped from proximal iliac crest to   mid thigh with shower curtain technique.      Time-out was performed identifying the patient, planned procedure, and   extremity.     An incision was then made 2 cm distal and lateral to the   anterior superior iliac spine extending over the orientation of the   tensor fascia lata muscle and sharp dissection was carried down to the   fascia of the muscle and protractor placed in the soft tissues.      The  fascia was then incised.  The muscle belly was identified and swept   laterally and retractor placed along the superior neck.  Following   cauterization of the circumflex vessels and removing some pericapsular   fat, a second cobra retractor was placed on the inferior neck.  A third   retractor was placed on the anterior acetabulum after elevating the   anterior rectus.  A L-capsulotomy was along the line of the   superior neck to the trochanteric fossa, then extended proximally and   distally.  Tag sutures were placed and the retractors were then placed   intracapsular.  We then identified the trochanteric fossa and   orientation of my neck cut, confirmed this radiographically   and then made a neck osteotomy with the femur on traction.  The femoral   head was removed without difficulty or complication.  Traction was let   off and retractors were placed posterior and anterior around the   acetabulum.      The labrum and foveal tissue were debrided.  I began reaming with a 27mm   reamer and reamed up to 51mm reamer with good bony bed preparation and a 58mm   cup was chosen.  The final 46mm Pinnacle  cup was then impacted under fluoroscopy  to confirm the depth of penetration and orientation with respect to   abduction.  A screw was placed followed by the hole eliminator.  The final   32+4 neitral Altrex liner was impacted with good visualized rim fit.  The cup was positioned anatomically within the acetabular portion of the pelvis.      At this point, the femur was rolled at 80 degrees.  Further capsule was   released off the inferior aspect of the femoral neck.  I then   released the superior capsule proximally.  The hook was placed laterally   along the femur and elevated manually and held in position with the bed   hook.  The leg was then extended and adducted with the leg rolled to 100   degrees of external rotation.  Once the proximal femur was fully   exposed, I used a box osteotome to set orientation.  I then began   broaching with the starting chili pepper broach and passed this by hand and then broached up to 2.  With the 2 broach in place I chose a high offset neck and did severa trial reductions.  The offset was appropriate, leg lengths   appeared to be equal best matching the orientation of her hips pre-operatively where her right hip was measured in similar fashion and noted to be slightly longer than her left.  We did our best to try and match this finding.   Given these findings, I went ahead and dislocated the hip, repositioned all   retractors and positioned the right hip in the extended and abducted position.  The final 2 Hi Tri Lock stem was   chosen and it was impacted down to the level of neck cut.  Based on this   and the trial reduction, a 32+1 delta ceramic ball was chosen and   impacted onto a clean and dry trunnion, and the hip was reduced.  The   hip had been irrigated throughout the case again at this point.  I did   reapproximate the superior capsular leaflet to the anterior leaflet   using #1 Vicryl.  The fascia of the   tensor fascia lata muscle was then  reapproximated  using #1 Vicryl and #1 Stratafix sutures.  The   remaining wound was closed with 2-0 Vicryl and running 4-0 Monocryl.   The hip was cleaned, dried, and dressed sterilely using Dermabond and   Aquacel dressing.  She was then brought   to recovery room in stable condition tolerating the procedure well.    Nehemiah Massed, PA-C was present for the entirety of the case involved from   preoperative positioning, perioperative retractor management, general   facilitation of the case, as well as primary wound closure as assistant.            Pietro Cassis Alvan Dame, M.D.        10/26/2017 3:55 PM

## 2017-10-26 NOTE — Anesthesia Procedure Notes (Signed)
Spinal  Patient location during procedure: OR End time: 10/26/2017 2:24 PM Staffing Performed: resident/CRNA  Preanesthetic Checklist Completed: patient identified, site marked, surgical consent, pre-op evaluation, timeout performed, IV checked, risks and benefits discussed and monitors and equipment checked Spinal Block Patient position: sitting Prep: DuraPrep Patient monitoring: heart rate, continuous pulse ox and blood pressure Approach: midline Location: L4-5 Injection technique: single-shot Needle Needle type: Pencan  Needle gauge: 24 G Needle length: 9 cm Assessment Sensory level: T10 Additional Notes Expiration date of kit checked and confirmed. Patient tolerated procedure well, without complications.

## 2017-10-26 NOTE — Interval H&P Note (Signed)
History and Physical Interval Note:  10/26/2017 1:12 PM  Karen Walsh  has presented today for surgery, with the diagnosis of Right hip osteoarthritis  The various methods of treatment have been discussed with the patient and family. After consideration of risks, benefits and other options for treatment, the patient has consented to  Procedure(s) with comments: RIGHT TOTAL HIP ARTHROPLASTY ANTERIOR APPROACH (Right) - 70 mins as a surgical intervention .  The patient's history has been reviewed, patient examined, no change in status, stable for surgery.  I have reviewed the patient's chart and labs.  Questions were answered to the patient's satisfaction.     Mauri Pole

## 2017-10-26 NOTE — Transfer of Care (Signed)
Immediate Anesthesia Transfer of Care Note  Patient: DAYSIE HELF  Procedure(s) Performed: RIGHT TOTAL HIP ARTHROPLASTY ANTERIOR APPROACH (Right Hip)  Patient Location: PACU  Anesthesia Type:MAC and Spinal  Level of Consciousness: awake, alert  and oriented  Airway & Oxygen Therapy: Patient Spontanous Breathing and Patient connected to face mask oxygen  Post-op Assessment: Report given to RN and Post -op Vital signs reviewed and stable  Post vital signs: Reviewed and stable  Last Vitals:  Vitals:   10/26/17 1205  BP: (!) 155/79  Pulse: 73  Resp: 18  Temp: 36.9 C  SpO2: 100%    Last Pain:  Vitals:   10/26/17 1221  TempSrc:   PainSc: 0-No pain      Patients Stated Pain Goal: 4 (09/81/19 1478)  Complications: No apparent anesthesia complications

## 2017-10-27 ENCOUNTER — Encounter (HOSPITAL_COMMUNITY): Payer: Self-pay | Admitting: Orthopedic Surgery

## 2017-10-27 LAB — BASIC METABOLIC PANEL
Anion gap: 6 (ref 5–15)
BUN: 9 mg/dL (ref 6–20)
CHLORIDE: 101 mmol/L (ref 101–111)
CO2: 26 mmol/L (ref 22–32)
Calcium: 8.6 mg/dL — ABNORMAL LOW (ref 8.9–10.3)
Creatinine, Ser: 0.49 mg/dL (ref 0.44–1.00)
Glucose, Bld: 143 mg/dL — ABNORMAL HIGH (ref 65–99)
POTASSIUM: 4.1 mmol/L (ref 3.5–5.1)
SODIUM: 133 mmol/L — AB (ref 135–145)

## 2017-10-27 LAB — CBC
HCT: 33.8 % — ABNORMAL LOW (ref 36.0–46.0)
HEMOGLOBIN: 11.2 g/dL — AB (ref 12.0–15.0)
MCH: 31.3 pg (ref 26.0–34.0)
MCHC: 33.1 g/dL (ref 30.0–36.0)
MCV: 94.4 fL (ref 78.0–100.0)
PLATELETS: 231 10*3/uL (ref 150–400)
RBC: 3.58 MIL/uL — AB (ref 3.87–5.11)
RDW: 13.3 % (ref 11.5–15.5)
WBC: 13.8 10*3/uL — AB (ref 4.0–10.5)

## 2017-10-27 MED ORDER — ONDANSETRON HCL 4 MG PO TABS: 4.0000 mg | ORAL_TABLET | Freq: Four times a day (QID) | ORAL | 0 refills | Status: DC | PRN

## 2017-10-27 NOTE — Evaluation (Signed)
Physical Therapy Evaluation Patient Details Name: Karen Walsh MRN: [id number] DOB: July 04, 1941 Today's Date: 10/27/2017   History of Present Illness  [name]  Clinical Impression  The patient reports feeling flushed and mildly nauseated after ambulation. BP 131/63. Plans DC later today.    Follow Up Recommendations No PT follow up;DC plan and follow up therapy as arranged by surgeon    Equipment Recommendations  None recommended by PT    Recommendations for Other Services OT consult     Precautions / Restrictions Precautions Precautions: Fall      Mobility  Bed Mobility Overal bed mobility: Needs Assistance Bed Mobility: Supine to Sit     Supine to sit: Min assist     General bed mobility comments: assist with trunk  Transfers Overall transfer level: Needs assistance Equipment used: Rolling walker (2 wheeled) Transfers: Sit to/from Stand Sit to Stand: Min assist         General transfer comment: cues for hand and right leg  Ambulation/Gait Ambulation/Gait assistance: Min assist Ambulation Distance (Feet): 120 Feet Assistive device: Rolling walker (2 wheeled) Gait Pattern/deviations: Step-to pattern;Step-through pattern;Antalgic     General Gait Details: cues for sequence.  Stairs            Wheelchair Mobility    Modified Rankin (Stroke Patients Only)       Balance                                             Pertinent Vitals/Pain Pain Assessment: 0-10 Pain Score: 5  Pain Location: right hip with WB Pain Descriptors / Indicators: Aching Pain Intervention(s): Monitored during session;Limited activity within patient's tolerance;Premedicated before session;Repositioned    Home Living Family/patient expects to be discharged to:: Private residence Living Arrangements: Spouse/significant other Available Help at Discharge: Family Type of Home: House Home Access: Stairs to enter   Technical brewer of Steps: 1  +1 Home Layout: Two level;Able to live on main level with bedroom/bathroom Home Equipment: Gilford Rile - 2 wheels      Prior Function Level of Independence: Independent               Hand Dominance        Extremity/Trunk Assessment        Lower Extremity Assessment Lower Extremity Assessment: RLE deficits/detail RLE Deficits / Details: advances the leg, reports right leg feels shorter    Cervical / Trunk Assessment Cervical / Trunk Assessment: Normal  Communication   Communication: No difficulties  Cognition Arousal/Alertness: Awake/alert Behavior During Therapy: WFL for tasks assessed/performed Overall Cognitive Status: Within Functional Limits for tasks assessed                                        General Comments      Exercises Total Joint Exercises Ankle Circles/Pumps: AROM;Both;10 reps Quad Sets: AROM;Both;10 reps Short Arc Quad: AROM;Right;10 reps Heel Slides: AAROM;Right;10 reps Hip ABduction/ADduction: AAROM;Right;10 reps   Assessment/Plan    PT Assessment Patient needs continued PT services  PT Problem List Decreased strength;Decreased range of motion;Decreased activity tolerance;Decreased mobility;Decreased safety awareness;Decreased knowledge of precautions;Decreased knowledge of use of DME;Pain       PT Treatment Interventions DME instruction;Therapeutic exercise;Gait training;Stair training;Functional mobility training;Therapeutic activities;Patient/family education    PT Goals (Current goals can be  found in the Care Plan section)  Acute Rehab PT Goals Patient Stated Goal: to go home PT Goal Formulation: With patient Time For Goal Achievement: 10/30/17 Potential to Achieve Goals: Good    Frequency 7X/week   Barriers to discharge        Co-evaluation               AM-PAC PT "6 Clicks" Daily Activity  Outcome Measure Difficulty turning over in bed (including adjusting bedclothes, sheets and blankets)?: A  Little Difficulty moving from lying on back to sitting on the side of the bed? : A Little Difficulty sitting down on and standing up from a chair with arms (e.g., wheelchair, bedside commode, etc,.)?: A Little Help needed moving to and from a bed to chair (including a wheelchair)?: A Little Help needed walking in hospital room?: A Little Help needed climbing 3-5 steps with a railing? : A Lot 6 Click Score: 17    End of Session Equipment Utilized During Treatment: Gait belt Activity Tolerance: Patient tolerated treatment well Patient left: in chair;with call bell/phone within reach;with chair alarm set Nurse Communication: Mobility status PT Visit Diagnosis: Unsteadiness on feet (R26.81);Pain Pain - Right/Left: Right Pain - part of body: Hip    Time: 3159-4585 PT Time Calculation (min) (ACUTE ONLY): 41 min   Charges:   PT Evaluation $PT Eval Low Complexity: 1 Low PT Treatments $Gait Training: 8-22 mins   PT G CodesTresa Endo PT [phone]   Claretha Cooper 10/27/2017, 10:58 AM

## 2017-10-27 NOTE — Progress Notes (Signed)
Physical Therapy Treatment Patient Details Name: EMSLEE LOPEZMARTINEZ MRN: [id number] DOB: [birth year] Today's Date: 10/27/2017    History of Present Illness [name]    PT Comments    Progressing well. Ready for DC.   Follow Up Recommendations  No PT follow up;DC plan and follow up therapy as arranged by surgeon     Equipment Recommendations       Recommendations for Other Services       Precautions / Restrictions Precautions Precautions: Fall    Mobility  Bed Mobility Overal bed mobility: Modified Independent Bed Mobility: Supine to Sit     Supine to sit: Supervision     General bed mobility comments: assist with trunk  Transfers Overall transfer level: Needs assistance Equipment used: Rolling walker (2 wheeled) Transfers: Sit to/from Stand Sit to Stand: Supervision Stand pivot transfers: Supervision       General transfer comment: cues for hand and right leg  Ambulation/Gait Ambulation/Gait assistance: Supervision Ambulation Distance (Feet): 120 Feet Assistive device: Rolling walker (2 wheeled) Gait Pattern/deviations: Step-to pattern;Step-through pattern     General Gait Details: cues for sequence.   Stairs Stairs: Yes   Stair Management: No rails;Forwards;With walker Number of Stairs: 1 General stair comments: cues for technique  Wheelchair Mobility    Modified Rankin (Stroke Patients Only)       Balance                                            Cognition Arousal/Alertness: Awake/alert Behavior During Therapy: WFL for tasks assessed/performed Overall Cognitive Status: Within Functional Limits for tasks assessed                                        Exercises Total Joint Exercises Long Arc Quad: AROM;Right;10 reps Marching in Standing: AAROM;Seated;Right;10 reps;Standing Standing Hip Extension: Right;AROM;Standing;10 reps    General Comments        Pertinent Vitals/Pain Pain Score: 1  Pain  Location: r hip Pain Descriptors / Indicators: Sore Pain Intervention(s): Monitored during session    Home Living Family/patient expects to be discharged to:: Private residence Living Arrangements: Spouse/significant other Available Help at Discharge: Family Type of Home: House Home Access: Stairs to enter   Home Layout: Two level;Able to live on main level with bedroom/bathroom Home Equipment: Walker - 2 wheels      Prior Function Level of Independence: Independent          PT Goals (current goals can now be found in the care plan section) Acute Rehab PT Goals Patient Stated Goal: to go home Progress towards PT goals: Progressing toward goals    Frequency           PT Plan Current plan remains appropriate    Co-evaluation              AM-PAC PT "6 Clicks" Daily Activity  Outcome Measure  Difficulty turning over in bed (including adjusting bedclothes, sheets and blankets)?: None Difficulty moving from lying on back to sitting on the side of the bed? : None Difficulty sitting down on and standing up from a chair with arms (e.g., wheelchair, bedside commode, etc,.)?: None Help needed moving to and from a bed to chair (including a wheelchair)?: None Help needed walking in hospital room?: A Little  Help needed climbing 3-5 steps with a railing? : A Little 6 Click Score: 22    End of Session   Activity Tolerance: Patient tolerated treatment well Patient left: in bed Nurse Communication: Mobility status       Time: 1421-1450 PT Time Calculation (min) (ACUTE ONLY): 29 min  Charges:  $Gait Training: 8-22 mins $Therapeutic Exercise: 8-22 mins                    G Codes:        Claretha Cooper 10/27/2017, 3:13 PM

## 2017-10-27 NOTE — Progress Notes (Signed)
Occupational Therapy Treatment Patient Details Name: Karen Walsh MRN: [id number] DOB: 1941/10/01 Today's Date: 10/27/2017    History of present illness Karen Walsh   OT comments  OT education cmomplete.  No DME needs from OT   Follow Up Recommendations  No OT follow up    Equipment Recommendations  None recommended by OT       Precautions / Restrictions Precautions Precautions: Fall       Mobility Bed Mobility Overal bed mobility: Needs Assistance Bed Mobility: Supine to Sit     Supine to sit: Supervision     General bed mobility comments: assist with trunk  Transfers Overall transfer level: Needs assistance Equipment used: Rolling walker (2 wheeled) Transfers: Sit to/from [facility] Sit to Stand: Supervision Stand pivot transfers: Supervision       General transfer comment: cues for hand and right leg        ADL either performed or assessed with clinical judgement   ADL Overall ADL's : Needs assistance/impaired Eating/Feeding: Set up   Grooming: Sitting;Set up   Upper Body Bathing: Set up;Sitting   Lower Body Bathing: Minimal assistance;Sit to/from stand;Cueing for safety;Cueing for sequencing   Upper Body Dressing : Set up;Sitting   Lower Body Dressing: Minimal assistance;Sit to/from stand;Cueing for safety;Cueing for sequencing   Toilet Transfer: Supervision/safety;RW;Ambulation;Comfort height toilet   Toileting- Clothing Manipulation and Hygiene: Supervision/safety;Sit to/from stand;Cueing for safety;Cueing for sequencing   Tub/ Shower Transfer: Walk-in shower;Min guard;Cueing for safety;Cueing for sequencing   Functional mobility during ADLs: Supervision/safety General ADL Comments: OT education complete.  Husband will A as needed      Vision Patient Visual Report: No change from baseline     Perception     Praxis      Cognition Arousal/Alertness: Awake/alert Behavior During Therapy: WFL for tasks  assessed/performed Overall Cognitive Status: Within Functional Limits for tasks assessed                                                     Pertinent Vitals/ Pain       Pain Assessment: 0-10 Pain Score: 2  Pain Location: Karen hip Pain Descriptors / Indicators: Sore Pain Intervention(s): Limited activity within patient's tolerance;Monitored during session  Home Living Family/patient expects to be discharged to:: Private residence Living Arrangements: Spouse/significant other Available Help at Discharge: Family Type of Home: House Home Access: Stairs to enter Technical brewer of Steps: 1 +1   Home Layout: Two level;Able to live on main level with bedroom/bathroom     Bathroom Shower/Tub: Walk-in shower         Home Equipment: Environmental consultant - 2 wheels          Prior Functioning/Environment Level of Independence: Independent            Frequency  Min 2X/week        Progress Toward Goals  OT Goals(current goals can now be found in the care plan section)  Progress towards OT goals: Progressing toward goals  Acute Rehab OT Goals Patient Stated Goal: to go home OT Goal Formulation: With patient Time For Goal Achievement: 11/03/17 ADL Goals Pt Will Perform Lower Body Dressing: with modified independence;sit to/from stand Pt Will Transfer to Toilet: with modified independence;ambulating Pt Will Perform Toileting - Clothing Manipulation and hygiene: with modified independence;sit to/from stand Pt Will Perform Tub/Shower  Transfer: with modified independence;Shower transfer  Plan Discharge plan remains appropriate    Co-evaluation                 AM-PAC PT "6 Clicks" Daily Activity     Outcome Measure   Help from another person eating meals?: None Help from another person taking care of personal grooming?: None Help from another person toileting, which includes using toliet, bedpan, or urinal?: A Little Help from another person  bathing (including washing, rinsing, drying)?: A Little Help from another person to put on and taking off regular upper body clothing?: None Help from another person to put on and taking off regular lower body clothing?: A Little 6 Click Score: 21    End of Session Equipment Utilized During Treatment: Rolling walker  OT Visit Diagnosis: Pain;Unsteadiness on feet (R26.81) Pain - Right/Left: Right Pain - part of body: Hip   Activity Tolerance Patient tolerated treatment well   Patient Left in bed;with call bell/phone within reach   Nurse Communication Mobility status        Time: 1242-1310 OT Time Calculation (min): 28 min  Charges: OT General Charges $OT Visit: 1 Visit OT Evaluation $OT Eval Low Complexity: 1 Low OT Treatments $Self Care/Home Management : 23-37 mins  Karen Walsh, Karen Walsh   Karen Walsh D 10/27/2017, 1:18 PM

## 2017-10-27 NOTE — Progress Notes (Signed)
     Subjective: 1 Day Post-Op Procedure(s) (LRB): RIGHT TOTAL HIP ARTHROPLASTY ANTERIOR APPROACH (Right)   Patient reports pain as mild, pain controlled.  No events throughout the night.  Some nausea yesterday, but it seems to have resolved this morning.   Ready to be discharged home.   Objective:   VITALS:   Vitals:   10/27/17 0623 10/27/17 0939  BP: 94/60 117/66  Pulse: 79 75  Resp: 15 16  Temp: 98.1 F (36.7 C) 98.4 F (36.9 C)  SpO2: 100% 100%    Dorsiflexion/Plantar flexion intact Incision: dressing C/D/I No cellulitis present Compartment soft  LABS Recent Labs    10/27/17 0531  HGB 11.2*  HCT 33.8*  WBC 13.8*  PLT 231    Recent Labs    10/27/17 0531  NA 133*  K 4.1  BUN 9  CREATININE 0.49  GLUCOSE 143*     Assessment/Plan: 1 Day Post-Op Procedure(s) (LRB): RIGHT TOTAL HIP ARTHROPLASTY ANTERIOR APPROACH (Right) Foley cath d/c'ed Advance diet Up with therapy D/C IV fluids Discharge home Follow up in 2 weeks at [facility]. Follow up with OLIN,Welton Bord D in 2 weeks.  Contact information:  [facility] [address] [place] Rodney Yera   PAC  10/27/2017, 10:00 AM

## 2017-10-27 NOTE — Evaluation (Signed)
Occupational Therapy Evaluation Patient Details Name: Karen Walsh MRN: [id number] DOB: 1940-12-27 Today's Date: 10/27/2017    History of Present Illness [name]   Clinical Impression   Pt is s/p THA resulting in the deficits listed below (see OT Problem List).  Pt will benefit from skilled OT to increase their safety and independence with ADL and functional mobility for ADL to facilitate discharge to venue listed below.        Follow Up Recommendations  No OT follow up    Equipment Recommendations  None recommended by OT       Precautions / Restrictions Precautions Precautions: Fall      Mobility Bed Mobility Overal bed mobility: Needs Assistance Bed Mobility: Sit to Supine     Supine to sit: Min assist     General bed mobility comments: assist with trunk  Transfers Overall transfer level: Needs assistance Equipment used: Rolling walker (2 wheeled) Transfers: Sit to/from Stand Sit to Stand: Min assist         General transfer comment: cues for hand and right leg        ADL either performed or assessed with clinical judgement   ADL Overall ADL's : Needs assistance/impaired Eating/Feeding: Set up   Grooming: Sitting;Set up   Upper Body Bathing: Set up;Sitting   Lower Body Bathing: Minimal assistance;Sit to/from stand;Cueing for safety;Cueing for sequencing   Upper Body Dressing : Set up;Sitting   Lower Body Dressing: Minimal assistance;Sit to/from stand;Cueing for safety;Cueing for sequencing   Toilet Transfer: Minimal assistance;BSC;RW;Comfort height toilet   Toileting- Clothing Manipulation and Hygiene: Minimal assistance;Sit to/from stand         General ADL Comments: pt needed to lie down toward end of OT session.  OT will return to complete OT education     Vision Patient Visual Report: No change from baseline              Pertinent Vitals/Pain Pain Assessment: 0-10 Pain Score: 3  Pain Location: r hip Pain Descriptors /  Indicators: Sore Pain Intervention(s): Limited activity within patient's tolerance     Hand Dominance     Extremity/Trunk Assessment Upper Extremity Assessment Upper Extremity Assessment: Overall WFL for tasks assessed   Lower Extremity Assessment Lower Extremity Assessment: RLE deficits/detail RLE Deficits / Details: advances the leg, reports right leg feels shorter   Cervical / Trunk Assessment Cervical / Trunk Assessment: Normal   Communication Communication Communication: No difficulties   Cognition Arousal/Alertness: Awake/alert Behavior During Therapy: WFL for tasks assessed/performed Overall Cognitive Status: Within Functional Limits for tasks assessed                                                Home Living Family/patient expects to be discharged to:: Private residence Living Arrangements: Spouse/significant other Available Help at Discharge: Family Type of Home: House Home Access: Stairs to enter Technical brewer of Steps: 1 +1   Home Layout: Two level;Able to live on main level with bedroom/bathroom     Bathroom Shower/Tub: Walk-in shower         Home Equipment: Walker - 2 wheels          Prior Functioning/Environment Level of Independence: Independent                 OT Problem List: Decreased strength;Decreased activity tolerance;Pain  OT Treatment/Interventions: Self-care/ADL training;Patient/family education    OT Goals(Current goals can be found in the care plan section) Acute Rehab OT Goals Patient Stated Goal: to go home OT Goal Formulation: With patient Time For Goal Achievement: 11/03/17  OT Frequency: Min 2X/week   Barriers to D/C:               AM-PAC PT "6 Clicks" Daily Activity     Outcome Measure Help from another person eating meals?: None Help from another person taking care of personal grooming?: None Help from another person toileting, which includes using toliet, bedpan, or  urinal?: A Little Help from another person bathing (including washing, rinsing, drying)?: A Little Help from another person to put on and taking off regular upper body clothing?: None Help from another person to put on and taking off regular lower body clothing?: A Little 6 Click Score: 21   End of Session Nurse Communication: Mobility status  Activity Tolerance: Patient limited by fatigue Patient left: in bed;with call bell/phone within reach  OT Visit Diagnosis: Pain;Unsteadiness on feet (R26.81) Pain - Right/Left: Right Pain - part of body: Hip                Time: 2202-5427 OT Time Calculation (min): 32 min Charges:  OT General Charges $OT Visit: 1 Visit OT Evaluation $OT Eval Low Complexity: 1 Low OT Treatments $Self Care/Home Management : 8-22 mins G-Codes:     Kari Baars, OT [phone]  [name] 10/27/2017, 12:11 PM

## 2017-10-27 NOTE — Anesthesia Postprocedure Evaluation (Signed)
Anesthesia Post Note  Patient: Karen Walsh  Procedure(s) Performed: RIGHT TOTAL HIP ARTHROPLASTY ANTERIOR APPROACH (Right Hip)     Patient location during evaluation: PACU Anesthesia Type: Spinal Level of consciousness: oriented and awake and alert Pain management: pain level controlled Vital Signs Assessment: post-procedure vital signs reviewed and stable Respiratory status: spontaneous breathing, respiratory function stable, patient connected to nasal cannula oxygen and nonlabored ventilation Cardiovascular status: blood pressure returned to baseline and stable Postop Assessment: no headache, no backache, no apparent nausea or vomiting, spinal receding and patient able to bend at knees Anesthetic complications: no    Last Vitals:  Vitals:   10/27/17 0131 10/27/17 0623  BP: 121/77 94/60  Pulse: 81 79  Resp: 12 15  Temp: (!) 36.4 C 36.7 C  SpO2: 100% 100%    Last Pain:  Vitals:   10/27/17 0700  TempSrc:   PainSc: 2                  Catalina Gravel

## 2017-11-02 NOTE — Discharge Summary (Signed)
Physician Discharge Summary  Patient ID: Karen Walsh MRN: [id number] DOB/AGE: [birth year] 77 y.o.  Admit date: 10/26/2017 Discharge date: 10/27/2017   Procedures:  Procedure(s) (LRB): RIGHT TOTAL HIP ARTHROPLASTY ANTERIOR APPROACH (Right)  Attending Physician:  Dr. Paralee Cancel   Admission Diagnoses:   Right hip primary OA / pain  Discharge Diagnoses:  Principal Problem:   S/P right THA, AA Active Problems:   S/P hip replacement  Past Medical History:  Diagnosis Date  . Anemia    History of prior to hysterectomy  . Arthritis    thumb, right hip  . Clostridium difficile infection 2017  . Cold hands and feet   . Colon polyps   . Diverticulosis 08/04/2012   noted on colonoscopy  . Hypothyroidism   . Internal hemorrhoids   . Nephritis    childhood  . PONV (postoperative nausea and vomiting)   . Thyroid disease     HPI:     Karen Walsh, 77 y.o. female, has a history of pain and functional disability in the right hip(s) due to arthritis and patient has failed non-surgical conservative treatments for greater than 12 weeks to include NSAID's and/or analgesics and activity modification.  Onset of symptoms was gradual starting  years ago with gradually worsening course since that time.The patient noted no past surgery on the right hip(s).  Patient currently rates pain in the right hip at 8 out of 10 with activity. Patient has night pain, worsening of pain with activity and weight bearing, trendelenberg gait, pain that interfers with activities of daily living and pain with passive range of motion. Patient has evidence of periarticular osteophytes and joint space narrowing by imaging studies. This condition presents safety issues increasing the risk of falls.  There is no current active infection.  Risks, benefits and expectations were discussed with the patient.  Risks including but not limited to the risk of anesthesia, blood clots, nerve damage, blood vessel damage, failure  of the prosthesis, infection and up to and including death.  Patient understand the risks, benefits and expectations and wishes to proceed with surgery.   PCP: Shon Baton, MD   Discharged Condition: good  Hospital Course:  Patient underwent the above stated procedure on 10/26/2017. Patient tolerated the procedure well and brought to the recovery room in good condition and subsequently to the floor.  POD #1 BP: 117/66 ; Pulse: 75 ; Temp: 98.4 F (36.9 C) ; Resp: 16 Patient reports pain as mild, pain controlled.  No events throughout the night.  Some nausea yesterday, but it seems to have resolved this morning.   Ready to be discharged home.  Dorsiflexion/plantar flexion intact, incision: dressing C/D/I, no cellulitis present and compartment soft.   LABS  Basename    HGB     11.2  HCT     33.8    Discharge Exam: General appearance: alert, cooperative and no distress Extremities: Homans sign is negative, no sign of DVT, no edema, redness or tenderness in the calves or thighs and no ulcers, gangrene or trophic changes  Disposition: Home with follow up in 2 weeks   Follow-up Information    Paralee Cancel, MD. Schedule an appointment as soon as possible for a visit in 2 week(s).   Specialty:  Orthopedic Surgery Contact information: [address] [place] [address] [phone]           Discharge Instructions    Call MD / Call 911   Complete by:  As directed  If you experience chest pain or shortness of breath, CALL 911 and be transported to the hospital emergency room.  If you develope a fever above 101 F, pus (white drainage) or increased drainage or redness at the wound, or calf pain, call your surgeon's office.   Change dressing   Complete by:  As directed    Maintain surgical dressing until follow up in the clinic. If the edges start to pull up, may reinforce with tape. If the dressing is no longer working, may remove and cover with gauze and tape,  but must keep the area dry and clean.  Call with any questions or concerns.   Constipation Prevention   Complete by:  As directed    Drink plenty of fluids.  Prune juice may be helpful.  You may use a stool softener, such as Colace (over the counter) 100 mg twice a day.  Use MiraLax (over the counter) for constipation as needed.   Diet - low sodium heart healthy   Complete by:  As directed    Discharge instructions   Complete by:  As directed    Maintain surgical dressing until follow up in the clinic. If the edges start to pull up, may reinforce with tape. If the dressing is no longer working, may remove and cover with gauze and tape, but must keep the area dry and clean.  Follow up in 2 weeks at [facility]. Call with any questions or concerns.   Increase activity slowly as tolerated   Complete by:  As directed    Weight bearing as tolerated with assist device (walker, cane, etc) as directed, use it as long as suggested by your surgeon or therapist, typically at least 4-6 weeks.   TED hose   Complete by:  As directed    Use stockings (TED hose) for 2 weeks on both leg(s).  You may remove them at night for sleeping.      Allergies as of 10/27/2017   No Known Allergies     Medication List    STOP taking these medications   acetaminophen 500 MG tablet Commonly known as:  TYLENOL   aspirin EC 81 MG tablet Replaced by:  aspirin 81 MG chewable tablet   naproxen sodium 220 MG tablet Commonly known as:  ALEVE     TAKE these medications   aspirin 81 MG chewable tablet Commonly known as:  ASPIRIN CHILDRENS Chew 1 tablet (81 mg total) by mouth 2 (two) times daily. Take for 4 weeks, then resume regular dose. Replaces:  aspirin EC 81 MG tablet   B12 FOLATE PO Take 1 capsule by mouth daily.   carboxymethylcellulose 0.5 % Soln Commonly known as:  REFRESH PLUS Place 2 drops into both eyes daily.   CITRACAL + D PO Take 1 tablet by mouth 2 (two) times daily.   docusate  sodium 100 MG capsule Commonly known as:  COLACE Take 1 capsule (100 mg total) by mouth 2 (two) times daily.   ferrous sulfate 325 (65 FE) MG tablet Commonly known as:  FERROUSUL Take 1 tablet (325 mg total) by mouth 3 (three) times daily with meals.   HYDROcodone-acetaminophen 7.5-325 MG tablet Commonly known as:  NORCO Take 1-2 tablets by mouth every 4 (four) hours as needed for moderate pain or severe pain.   levothyroxine 100 MCG tablet Commonly known as:  SYNTHROID, LEVOTHROID Take 100 mcg by mouth daily before breakfast.   liothyronine 25 MCG tablet Commonly known as:  CYTOMEL Take 25  mcg by mouth daily.   MAGNESIUM CITRATE PO Take 400 mg by mouth 2 (two) times daily.   methocarbamol 500 MG tablet Commonly known as:  ROBAXIN Take 1 tablet (500 mg total) by mouth every 6 (six) hours as needed for muscle spasms.   ondansetron 4 MG tablet Commonly known as:  ZOFRAN Take 1 tablet (4 mg total) by mouth every 6 (six) hours as needed for nausea.   polyethylene glycol packet Commonly known as:  MIRALAX / GLYCOLAX Take 17 g by mouth 2 (two) times daily.   psyllium 58.6 % packet Commonly known as:  METAMUCIL Take 1 packet by mouth at bedtime.   vitamin B-12 500 MCG tablet Commonly known as:  CYANOCOBALAMIN Take 500 mcg by mouth daily.   vitamin C 1000 MG tablet Take 1,000-2,000 mg by mouth See admin instructions. Take 1000 mg by mouth in the morning and take 2000 mg by mouth at bedtime            Discharge Care Instructions  (From admission, onward)        Start     Ordered   10/27/17 0000  Change dressing    Comments:  Maintain surgical dressing until follow up in the clinic. If the edges start to pull up, may reinforce with tape. If the dressing is no longer working, may remove and cover with gauze and tape, but must keep the area dry and clean.  Call with any questions or concerns.   10/27/17 1003       Signed: West Pugh. Inaara Tye   PA-C  11/02/2017,  12:31 PM

## 2017-11-08 DIAGNOSIS — Z96641 Presence of right artificial hip joint: Secondary | ICD-10-CM | POA: Diagnosis not present

## 2017-11-08 DIAGNOSIS — Z471 Aftercare following joint replacement surgery: Secondary | ICD-10-CM | POA: Diagnosis not present

## 2017-12-07 ENCOUNTER — Encounter: Payer: Self-pay | Admitting: Internal Medicine

## 2017-12-08 DIAGNOSIS — Z471 Aftercare following joint replacement surgery: Secondary | ICD-10-CM | POA: Diagnosis not present

## 2017-12-08 DIAGNOSIS — Z96641 Presence of right artificial hip joint: Secondary | ICD-10-CM | POA: Diagnosis not present

## 2017-12-16 DIAGNOSIS — M25551 Pain in right hip: Secondary | ICD-10-CM | POA: Diagnosis not present

## 2017-12-23 DIAGNOSIS — M25551 Pain in right hip: Secondary | ICD-10-CM | POA: Diagnosis not present

## 2018-01-06 DIAGNOSIS — M25551 Pain in right hip: Secondary | ICD-10-CM | POA: Diagnosis not present

## 2018-01-11 DIAGNOSIS — M25551 Pain in right hip: Secondary | ICD-10-CM | POA: Diagnosis not present

## 2018-01-14 DIAGNOSIS — T1512XA Foreign body in conjunctival sac, left eye, initial encounter: Secondary | ICD-10-CM | POA: Diagnosis not present

## 2018-02-01 ENCOUNTER — Ambulatory Visit (AMBULATORY_SURGERY_CENTER): Payer: Self-pay | Admitting: *Deleted

## 2018-02-01 ENCOUNTER — Other Ambulatory Visit: Payer: Self-pay

## 2018-02-01 ENCOUNTER — Encounter: Payer: Self-pay | Admitting: Internal Medicine

## 2018-02-01 VITALS — Ht 62.0 in | Wt 111.6 lb

## 2018-02-01 DIAGNOSIS — Z86010 Personal history of colonic polyps: Secondary | ICD-10-CM

## 2018-02-01 MED ORDER — PEG-KCL-NACL-NASULF-NA ASC-C 140 G PO SOLR: 1.0000 | ORAL | 0 refills | Status: DC

## 2018-02-01 NOTE — Progress Notes (Signed)
No egg or soy allergy known to patient  issues with past sedation with any surgeries  or procedures of PONV , no intubation problems  No diet pills per patient No home 02 use per patient  No blood thinners per patient  Pt denies issues with constipation - pt takes Metamucil daily  No A fib or A flutter  EMMI video sent to pt's e mail - pt declined  HTA will not cover any prep- plenvu sample- lot [id number] exp 03-2019

## 2018-02-15 ENCOUNTER — Ambulatory Visit (AMBULATORY_SURGERY_CENTER): Payer: PPO | Admitting: Internal Medicine

## 2018-02-15 ENCOUNTER — Other Ambulatory Visit: Payer: Self-pay

## 2018-02-15 ENCOUNTER — Encounter: Payer: Self-pay | Admitting: Internal Medicine

## 2018-02-15 VITALS — BP 138/75 | HR 66 | Temp 99.1°F | Resp 16 | Ht 62.0 in | Wt 111.0 lb

## 2018-02-15 DIAGNOSIS — Z86010 Personal history of colonic polyps: Secondary | ICD-10-CM

## 2018-02-15 DIAGNOSIS — E039 Hypothyroidism, unspecified: Secondary | ICD-10-CM | POA: Diagnosis not present

## 2018-02-15 MED ORDER — SODIUM CHLORIDE 0.9 % IV SOLN: 500.0000 mL | Freq: Once | INTRAVENOUS | Status: AC

## 2018-02-15 NOTE — Progress Notes (Signed)
PT AND HUSBAND MOVED TO RECLINER ROOM B/C HUSBAND WAS IN BATHROOM WHEN jp CAME TO TALK TO THEM.  THEY ARE AWAITING MEETING WITH JP/MD.

## 2018-02-15 NOTE — Progress Notes (Signed)
Pt's states no medical or surgical changes since previsit or office visit. 

## 2018-02-15 NOTE — Patient Instructions (Signed)
*  handouts given to care partner on diverticulosis and hemorrhoids. Repeat colonoscopy no longer recommended due to age. Resume previous diet later today. Continue present medications.  YOU HAD AN ENDOSCOPIC PROCEDURE TODAY AT [facility]:   Refer to the procedure report that was given to you for any specific questions about what was found during the examination.  If the procedure report does not answer your questions, please call your gastroenterologist to clarify.  If you requested that your care partner not be given the details of your procedure findings, then the procedure report has been included in a sealed envelope for you to review at your convenience later.  YOU SHOULD EXPECT: Some feelings of bloating in the abdomen. Passage of more gas than usual.  Walking can help get rid of the air that was put into your GI tract during the procedure and reduce the bloating. If you had a lower endoscopy (such as a colonoscopy or flexible sigmoidoscopy) you may notice spotting of blood in your stool or on the toilet paper. If you underwent a bowel prep for your procedure, you may not have a normal bowel movement for a few days.  Please Note:  You might notice some irritation and congestion in your nose or some drainage.  This is from the oxygen used during your procedure.  There is no need for concern and it should clear up in a day or so.  SYMPTOMS TO REPORT IMMEDIATELY:   Following lower endoscopy (colonoscopy or flexible sigmoidoscopy):  Excessive amounts of blood in the stool  Significant tenderness or worsening of abdominal pains  Swelling of the abdomen that is new, acute  Fever of 100F or higher   For urgent or emergent issues, a gastroenterologist can be reached at any hour by calling [phone].   DIET:  We do recommend a small meal at first, but then you may proceed to your regular diet.  Drink plenty of fluids but you should avoid alcoholic beverages for 24  hours.  ACTIVITY:  You should plan to take it easy for the rest of today and you should NOT DRIVE or use heavy machinery until tomorrow (because of the sedation medicines used during the test).    FOLLOW UP: Our staff will call the number listed on your records the next business day following your procedure to check on you and address any questions or concerns that you may have regarding the information given to you following your procedure. If we do not reach you, we will leave a message.  However, if you are feeling well and you are not experiencing any problems, there is no need to return our call.  We will assume that you have returned to your regular daily activities without incident.  If any biopsies were taken you will be contacted by phone or by letter within the next 1-3 weeks.  Please call us at [phone] if you have not heard about the biopsies in 3 weeks.    SIGNATURES/CONFIDENTIALITY: You and/or your care partner have signed paperwork which will be entered into your electronic medical record.  These signatures attest to the fact that that the information above on your After Visit Summary has been reviewed and is understood.  Full responsibility of the confidentiality of this discharge information lies with you and/or your care-partner.

## 2018-02-15 NOTE — Progress Notes (Signed)
Report to PACU, RN, vss, BBS= Clear.  

## 2018-02-15 NOTE — Op Note (Signed)
[place] Patient Name: Karen Walsh Procedure Date: 02/15/2018 9:14 AM MRN: [id number] Endoscopist: Docia Chuck. Henrene Pastor , MD Age: 77 Referring MD:  Date of Birth: 1941-01-25 Gender: Female Account #: [id number] Procedure:                Colonoscopy Indications:              High risk colon cancer surveillance: Personal                            history of non-advanced adenoma. Previous                            examinations 2008 and 2013 Medicines:                Monitored Anesthesia Care Procedure:                Pre-Anesthesia Assessment:                           - Prior to the procedure, a History and Physical                            was performed, and patient medications and                            allergies were reviewed. The patient's tolerance of                            previous anesthesia was also reviewed. The risks                            and benefits of the procedure and the sedation                            options and risks were discussed with the patient.                            All questions were answered, and informed consent                            was obtained. Prior Anticoagulants: The patient has                            taken no previous anticoagulant or antiplatelet                            agents. ASA Grade Assessment: II - A patient with                            mild systemic disease. After reviewing the risks                            and benefits, the patient was deemed in  satisfactory condition to undergo the procedure.                           After obtaining informed consent, the colonoscope                            was passed under direct vision. Throughout the                            procedure, the patient's blood pressure, pulse, and                            oxygen saturations were monitored continuously. The                            Colonoscope was introduced through the anus and                             advanced to the the cecum, identified by                            appendiceal orifice and ileocecal valve. The                            ileocecal valve, appendiceal orifice, and rectum                            were photographed. The quality of the bowel                            preparation was excellent. The colonoscopy was                            performed without difficulty. The patient tolerated                            the procedure well. The bowel preparation used was                            SUPREP. Scope In: 9:17:08 AM Scope Out: 9:36:06 AM Scope Withdrawal Time: 0 hours 10 minutes 40 seconds  Total Procedure Duration: 0 hours 18 minutes 58 seconds  Findings:                 Multiple diverticula were found in the sigmoid                            colon.                           Internal hemorrhoids were found during retroflexion.                           The exam was otherwise without abnormality on  direct and retroflexion views. Complications:            No immediate complications. Estimated blood loss:                            None. Estimated Blood Loss:     Estimated blood loss: none. Impression:               - Diverticulosis in the sigmoid colon.                           - Internal hemorrhoids.                           - The examination was otherwise normal on direct                            and retroflexion views.                           - No specimens collected. Recommendation:           - Repeat colonoscopy is not recommended for                            surveillance.                           - Patient has a contact number available for                            emergencies. The signs and symptoms of potential                            delayed complications were discussed with the                            patient. Return to normal activities tomorrow.                             Written discharge instructions were provided to the                            patient.                           - Resume previous diet.                           - Continue present medications. Docia Chuck. Henrene Pastor, MD 02/15/2018 9:39:19 AM This report has been signed electronically.

## 2018-02-16 ENCOUNTER — Telehealth: Payer: Self-pay

## 2018-02-16 NOTE — Telephone Encounter (Signed)
lbgi   Follow up Call-  Call back number 02/15/2018  Post procedure Call Back phone  # [phone]  Permission to leave phone message Yes  Some recent data might be hidden     Patient questions:  Do you have a fever, pain , or abdominal swelling? No. Pain Score  0 *  Have you tolerated food without any problems? Yes.    Have you been able to return to your normal activities? Yes.    Do you have any questions about your discharge instructions: Diet   No. Medications  No. Follow up visit  No.  Do you have questions or concerns about your Care? No.  Actions: * If pain score is 4 or above: No action needed, pain <4.

## 2018-02-17 DIAGNOSIS — Z1231 Encounter for screening mammogram for malignant neoplasm of breast: Secondary | ICD-10-CM | POA: Diagnosis not present

## 2018-02-17 DIAGNOSIS — Z6820 Body mass index (BMI) 20.0-20.9, adult: Secondary | ICD-10-CM | POA: Diagnosis not present

## 2018-02-17 DIAGNOSIS — Z124 Encounter for screening for malignant neoplasm of cervix: Secondary | ICD-10-CM | POA: Diagnosis not present

## 2018-04-29 DIAGNOSIS — M859 Disorder of bone density and structure, unspecified: Secondary | ICD-10-CM | POA: Diagnosis not present

## 2018-04-29 DIAGNOSIS — E038 Other specified hypothyroidism: Secondary | ICD-10-CM | POA: Diagnosis not present

## 2018-04-29 DIAGNOSIS — R82998 Other abnormal findings in urine: Secondary | ICD-10-CM | POA: Diagnosis not present

## 2018-05-06 DIAGNOSIS — F900 Attention-deficit hyperactivity disorder, predominantly inattentive type: Secondary | ICD-10-CM | POA: Diagnosis not present

## 2018-05-06 DIAGNOSIS — M1990 Unspecified osteoarthritis, unspecified site: Secondary | ICD-10-CM | POA: Diagnosis not present

## 2018-05-06 DIAGNOSIS — M859 Disorder of bone density and structure, unspecified: Secondary | ICD-10-CM | POA: Diagnosis not present

## 2018-05-06 DIAGNOSIS — Z0000 Encounter for general adult medical examination without abnormal findings: Secondary | ICD-10-CM | POA: Diagnosis not present

## 2018-05-06 DIAGNOSIS — Z1389 Encounter for screening for other disorder: Secondary | ICD-10-CM | POA: Diagnosis not present

## 2018-05-06 DIAGNOSIS — N8189 Other female genital prolapse: Secondary | ICD-10-CM | POA: Diagnosis not present

## 2018-05-06 DIAGNOSIS — K648 Other hemorrhoids: Secondary | ICD-10-CM | POA: Diagnosis not present

## 2018-05-06 DIAGNOSIS — R030 Elevated blood-pressure reading, without diagnosis of hypertension: Secondary | ICD-10-CM | POA: Diagnosis not present

## 2018-05-06 DIAGNOSIS — H539 Unspecified visual disturbance: Secondary | ICD-10-CM | POA: Diagnosis not present

## 2018-05-06 DIAGNOSIS — Z6820 Body mass index (BMI) 20.0-20.9, adult: Secondary | ICD-10-CM | POA: Diagnosis not present

## 2018-05-06 DIAGNOSIS — E038 Other specified hypothyroidism: Secondary | ICD-10-CM | POA: Diagnosis not present

## 2018-07-07 DIAGNOSIS — Z23 Encounter for immunization: Secondary | ICD-10-CM | POA: Diagnosis not present

## 2018-07-29 DIAGNOSIS — L821 Other seborrheic keratosis: Secondary | ICD-10-CM | POA: Diagnosis not present

## 2018-07-29 DIAGNOSIS — L720 Epidermal cyst: Secondary | ICD-10-CM | POA: Diagnosis not present

## 2018-09-05 DIAGNOSIS — S7001XA Contusion of right hip, initial encounter: Secondary | ICD-10-CM | POA: Diagnosis not present

## 2018-09-05 DIAGNOSIS — M25551 Pain in right hip: Secondary | ICD-10-CM | POA: Diagnosis not present

## 2018-09-05 DIAGNOSIS — M79631 Pain in right forearm: Secondary | ICD-10-CM | POA: Diagnosis not present

## 2018-09-05 DIAGNOSIS — S5011XA Contusion of right forearm, initial encounter: Secondary | ICD-10-CM | POA: Diagnosis not present

## 2018-09-13 DIAGNOSIS — T1511XA Foreign body in conjunctival sac, right eye, initial encounter: Secondary | ICD-10-CM | POA: Diagnosis not present

## 2018-09-13 DIAGNOSIS — H01002 Unspecified blepharitis right lower eyelid: Secondary | ICD-10-CM | POA: Diagnosis not present

## 2018-09-13 DIAGNOSIS — H01005 Unspecified blepharitis left lower eyelid: Secondary | ICD-10-CM | POA: Diagnosis not present

## 2018-09-19 DIAGNOSIS — L570 Actinic keratosis: Secondary | ICD-10-CM | POA: Diagnosis not present

## 2018-09-19 DIAGNOSIS — M7981 Nontraumatic hematoma of soft tissue: Secondary | ICD-10-CM | POA: Diagnosis not present

## 2018-09-19 DIAGNOSIS — L821 Other seborrheic keratosis: Secondary | ICD-10-CM | POA: Diagnosis not present

## 2018-09-20 DIAGNOSIS — S5011XD Contusion of right forearm, subsequent encounter: Secondary | ICD-10-CM | POA: Diagnosis not present

## 2018-09-27 DIAGNOSIS — S5011XD Contusion of right forearm, subsequent encounter: Secondary | ICD-10-CM | POA: Diagnosis not present

## 2018-11-02 DIAGNOSIS — M5136 Other intervertebral disc degeneration, lumbar region: Secondary | ICD-10-CM | POA: Diagnosis not present

## 2018-11-02 DIAGNOSIS — Z96641 Presence of right artificial hip joint: Secondary | ICD-10-CM | POA: Diagnosis not present

## 2018-11-02 DIAGNOSIS — M1612 Unilateral primary osteoarthritis, left hip: Secondary | ICD-10-CM | POA: Diagnosis not present

## 2018-11-02 DIAGNOSIS — Z471 Aftercare following joint replacement surgery: Secondary | ICD-10-CM | POA: Diagnosis not present

## 2018-12-22 DIAGNOSIS — R509 Fever, unspecified: Secondary | ICD-10-CM | POA: Diagnosis not present

## 2019-05-03 DIAGNOSIS — R509 Fever, unspecified: Secondary | ICD-10-CM | POA: Diagnosis not present

## 2019-05-03 DIAGNOSIS — R197 Diarrhea, unspecified: Secondary | ICD-10-CM | POA: Diagnosis not present

## 2019-05-04 DIAGNOSIS — Z20828 Contact with and (suspected) exposure to other viral communicable diseases: Secondary | ICD-10-CM | POA: Diagnosis not present

## 2019-05-04 DIAGNOSIS — M859 Disorder of bone density and structure, unspecified: Secondary | ICD-10-CM | POA: Diagnosis not present

## 2019-05-04 DIAGNOSIS — E038 Other specified hypothyroidism: Secondary | ICD-10-CM | POA: Diagnosis not present

## 2019-05-04 DIAGNOSIS — R7989 Other specified abnormal findings of blood chemistry: Secondary | ICD-10-CM | POA: Diagnosis not present

## 2019-05-11 DIAGNOSIS — K649 Unspecified hemorrhoids: Secondary | ICD-10-CM | POA: Diagnosis not present

## 2019-05-11 DIAGNOSIS — M8580 Other specified disorders of bone density and structure, unspecified site: Secondary | ICD-10-CM | POA: Diagnosis not present

## 2019-05-11 DIAGNOSIS — Z1339 Encounter for screening examination for other mental health and behavioral disorders: Secondary | ICD-10-CM | POA: Diagnosis not present

## 2019-05-11 DIAGNOSIS — Z0000 Encounter for general adult medical examination without abnormal findings: Secondary | ICD-10-CM | POA: Diagnosis not present

## 2019-05-11 DIAGNOSIS — R82998 Other abnormal findings in urine: Secondary | ICD-10-CM | POA: Diagnosis not present

## 2019-05-11 DIAGNOSIS — R030 Elevated blood-pressure reading, without diagnosis of hypertension: Secondary | ICD-10-CM | POA: Diagnosis not present

## 2019-05-11 DIAGNOSIS — E039 Hypothyroidism, unspecified: Secondary | ICD-10-CM | POA: Diagnosis not present

## 2019-05-11 DIAGNOSIS — R197 Diarrhea, unspecified: Secondary | ICD-10-CM | POA: Diagnosis not present

## 2019-05-11 DIAGNOSIS — M1990 Unspecified osteoarthritis, unspecified site: Secondary | ICD-10-CM | POA: Diagnosis not present

## 2019-05-11 DIAGNOSIS — H539 Unspecified visual disturbance: Secondary | ICD-10-CM | POA: Diagnosis not present

## 2019-05-11 DIAGNOSIS — F900 Attention-deficit hyperactivity disorder, predominantly inattentive type: Secondary | ICD-10-CM | POA: Diagnosis not present

## 2019-05-11 DIAGNOSIS — N8189 Other female genital prolapse: Secondary | ICD-10-CM | POA: Diagnosis not present

## 2019-06-06 DIAGNOSIS — Z1159 Encounter for screening for other viral diseases: Secondary | ICD-10-CM | POA: Diagnosis not present

## 2019-07-27 DIAGNOSIS — Z23 Encounter for immunization: Secondary | ICD-10-CM | POA: Diagnosis not present

## 2019-08-07 DIAGNOSIS — M25551 Pain in right hip: Secondary | ICD-10-CM | POA: Diagnosis not present

## 2019-08-07 DIAGNOSIS — M5136 Other intervertebral disc degeneration, lumbar region: Secondary | ICD-10-CM | POA: Diagnosis not present

## 2019-08-07 DIAGNOSIS — Z96641 Presence of right artificial hip joint: Secondary | ICD-10-CM | POA: Diagnosis not present

## 2019-08-11 DIAGNOSIS — M65311 Trigger thumb, right thumb: Secondary | ICD-10-CM | POA: Diagnosis not present

## 2019-08-23 DIAGNOSIS — Z681 Body mass index (BMI) 19 or less, adult: Secondary | ICD-10-CM | POA: Diagnosis not present

## 2019-08-23 DIAGNOSIS — Z1231 Encounter for screening mammogram for malignant neoplasm of breast: Secondary | ICD-10-CM | POA: Diagnosis not present

## 2019-08-23 DIAGNOSIS — Z01419 Encounter for gynecological examination (general) (routine) without abnormal findings: Secondary | ICD-10-CM | POA: Diagnosis not present

## 2019-09-04 DIAGNOSIS — M7061 Trochanteric bursitis, right hip: Secondary | ICD-10-CM | POA: Diagnosis not present

## 2019-09-04 DIAGNOSIS — Z96641 Presence of right artificial hip joint: Secondary | ICD-10-CM | POA: Diagnosis not present

## 2019-09-20 DIAGNOSIS — L989 Disorder of the skin and subcutaneous tissue, unspecified: Secondary | ICD-10-CM | POA: Diagnosis not present

## 2019-09-20 DIAGNOSIS — L820 Inflamed seborrheic keratosis: Secondary | ICD-10-CM | POA: Diagnosis not present

## 2019-11-01 ENCOUNTER — Ambulatory Visit: Payer: PPO

## 2019-11-02 ENCOUNTER — Ambulatory Visit: Payer: PPO | Attending: Internal Medicine

## 2019-11-02 DIAGNOSIS — Z23 Encounter for immunization: Secondary | ICD-10-CM

## 2019-11-02 NOTE — Progress Notes (Signed)
   Covid-19 Vaccination Clinic  Name:  KALEESHA KALLAL    MRN: [id number] DOB: Feb 15, 1941  11/02/2019  Ms. Runck was observed post Covid-19 immunization for 15 minutes without incidence. She was provided with Vaccine Information Sheet and instruction to access the V-Safe system.   Ms. Bartone was instructed to call 911 with any severe reactions post vaccine: Marland Kitchen Difficulty breathing  . Swelling of your face and throat  . A fast heartbeat  . A bad rash all over your body  . Dizziness and weakness

## 2019-11-13 DIAGNOSIS — H4720 Unspecified optic atrophy: Secondary | ICD-10-CM | POA: Diagnosis not present

## 2019-11-13 DIAGNOSIS — H25813 Combined forms of age-related cataract, bilateral: Secondary | ICD-10-CM | POA: Diagnosis not present

## 2019-11-13 DIAGNOSIS — H43813 Vitreous degeneration, bilateral: Secondary | ICD-10-CM | POA: Diagnosis not present

## 2019-11-13 DIAGNOSIS — H52203 Unspecified astigmatism, bilateral: Secondary | ICD-10-CM | POA: Diagnosis not present

## 2019-11-23 ENCOUNTER — Ambulatory Visit: Payer: PPO | Attending: Internal Medicine

## 2019-11-23 DIAGNOSIS — Z23 Encounter for immunization: Secondary | ICD-10-CM

## 2019-11-23 NOTE — Progress Notes (Signed)
   Covid-19 Vaccination Clinic  Name:  Karen Walsh    MRN: [id number] DOB: September 28, 1941  11/23/2019  [name] was observed post Covid-19 immunization for 15 minutes without incidence. She was provided with Vaccine Information Sheet and instruction to access the V-Safe system.   [name] was instructed to call 911 with any severe reactions post vaccine: Marland Kitchen Difficulty breathing  . Swelling of your face and throat  . A fast heartbeat  . A bad rash all over your body  . Dizziness and weakness    Immunizations Administered    Name Date Dose VIS Date Route   Pfizer COVID-19 Vaccine 11/23/2019 10:44 AM 0.3 mL 09/22/2019 Intramuscular   Manufacturer: [place]   Lot: [id number]   [place]: [id number]

## 2020-01-11 DIAGNOSIS — M65311 Trigger thumb, right thumb: Secondary | ICD-10-CM | POA: Diagnosis not present

## 2020-03-04 DIAGNOSIS — E038 Other specified hypothyroidism: Secondary | ICD-10-CM | POA: Diagnosis not present

## 2020-03-14 DIAGNOSIS — R05 Cough: Secondary | ICD-10-CM | POA: Diagnosis not present

## 2020-03-14 DIAGNOSIS — J069 Acute upper respiratory infection, unspecified: Secondary | ICD-10-CM | POA: Diagnosis not present

## 2020-03-14 DIAGNOSIS — R350 Frequency of micturition: Secondary | ICD-10-CM | POA: Diagnosis not present

## 2020-03-14 DIAGNOSIS — N390 Urinary tract infection, site not specified: Secondary | ICD-10-CM | POA: Diagnosis not present

## 2020-03-28 DIAGNOSIS — M65311 Trigger thumb, right thumb: Secondary | ICD-10-CM | POA: Diagnosis not present

## 2020-04-16 DIAGNOSIS — M65311 Trigger thumb, right thumb: Secondary | ICD-10-CM | POA: Diagnosis not present

## 2020-05-16 DIAGNOSIS — E038 Other specified hypothyroidism: Secondary | ICD-10-CM | POA: Diagnosis not present

## 2020-05-16 DIAGNOSIS — M859 Disorder of bone density and structure, unspecified: Secondary | ICD-10-CM | POA: Diagnosis not present

## 2020-05-23 DIAGNOSIS — R82998 Other abnormal findings in urine: Secondary | ICD-10-CM | POA: Diagnosis not present

## 2020-05-23 DIAGNOSIS — F900 Attention-deficit hyperactivity disorder, predominantly inattentive type: Secondary | ICD-10-CM | POA: Diagnosis not present

## 2020-05-23 DIAGNOSIS — M1990 Unspecified osteoarthritis, unspecified site: Secondary | ICD-10-CM | POA: Diagnosis not present

## 2020-05-23 DIAGNOSIS — Z780 Asymptomatic menopausal state: Secondary | ICD-10-CM | POA: Diagnosis not present

## 2020-05-23 DIAGNOSIS — Z0000 Encounter for general adult medical examination without abnormal findings: Secondary | ICD-10-CM | POA: Diagnosis not present

## 2020-05-23 DIAGNOSIS — M8580 Other specified disorders of bone density and structure, unspecified site: Secondary | ICD-10-CM | POA: Diagnosis not present

## 2020-05-23 DIAGNOSIS — E039 Hypothyroidism, unspecified: Secondary | ICD-10-CM | POA: Diagnosis not present

## 2020-05-23 DIAGNOSIS — K649 Unspecified hemorrhoids: Secondary | ICD-10-CM | POA: Diagnosis not present

## 2020-05-23 DIAGNOSIS — R030 Elevated blood-pressure reading, without diagnosis of hypertension: Secondary | ICD-10-CM | POA: Diagnosis not present

## 2020-05-23 DIAGNOSIS — H539 Unspecified visual disturbance: Secondary | ICD-10-CM | POA: Diagnosis not present

## 2020-05-24 DIAGNOSIS — Z1212 Encounter for screening for malignant neoplasm of rectum: Secondary | ICD-10-CM | POA: Diagnosis not present

## 2020-06-07 DIAGNOSIS — E039 Hypothyroidism, unspecified: Secondary | ICD-10-CM | POA: Diagnosis not present

## 2020-06-13 ENCOUNTER — Other Ambulatory Visit: Payer: Self-pay | Admitting: Obstetrics and Gynecology

## 2020-06-13 DIAGNOSIS — N644 Mastodynia: Secondary | ICD-10-CM | POA: Diagnosis not present

## 2020-06-13 DIAGNOSIS — R895 Abnormal microbiological findings in specimens from other organs, systems and tissues: Secondary | ICD-10-CM | POA: Diagnosis not present

## 2020-06-14 ENCOUNTER — Ambulatory Visit
Admission: RE | Admit: 2020-06-14 | Discharge: 2020-06-14 | Disposition: A | Discharge status: Home / Self Care | Payer: PPO | Source: Ambulatory Visit | Attending: Obstetrics and Gynecology | Admitting: Obstetrics and Gynecology

## 2020-06-14 ENCOUNTER — Other Ambulatory Visit: Payer: Self-pay

## 2020-06-14 DIAGNOSIS — N6489 Other specified disorders of breast: Secondary | ICD-10-CM | POA: Diagnosis not present

## 2020-06-14 DIAGNOSIS — R928 Other abnormal and inconclusive findings on diagnostic imaging of breast: Secondary | ICD-10-CM | POA: Diagnosis not present

## 2020-06-14 DIAGNOSIS — N644 Mastodynia: Secondary | ICD-10-CM

## 2020-06-20 DIAGNOSIS — N951 Menopausal and female climacteric states: Secondary | ICD-10-CM | POA: Diagnosis not present

## 2020-06-25 DIAGNOSIS — N644 Mastodynia: Secondary | ICD-10-CM | POA: Diagnosis not present

## 2020-07-04 DIAGNOSIS — E039 Hypothyroidism, unspecified: Secondary | ICD-10-CM | POA: Diagnosis not present

## 2020-07-29 DIAGNOSIS — N390 Urinary tract infection, site not specified: Secondary | ICD-10-CM | POA: Diagnosis not present

## 2020-07-29 DIAGNOSIS — R300 Dysuria: Secondary | ICD-10-CM | POA: Diagnosis not present

## 2020-08-02 DIAGNOSIS — N644 Mastodynia: Secondary | ICD-10-CM | POA: Diagnosis not present

## 2020-08-27 DIAGNOSIS — Z6820 Body mass index (BMI) 20.0-20.9, adult: Secondary | ICD-10-CM | POA: Diagnosis not present

## 2020-08-27 DIAGNOSIS — Z779 Other contact with and (suspected) exposures hazardous to health: Secondary | ICD-10-CM | POA: Diagnosis not present

## 2020-08-27 DIAGNOSIS — R87615 Unsatisfactory cytologic smear of cervix: Secondary | ICD-10-CM | POA: Diagnosis not present

## 2020-08-27 DIAGNOSIS — Z124 Encounter for screening for malignant neoplasm of cervix: Secondary | ICD-10-CM | POA: Diagnosis not present

## 2020-08-27 DIAGNOSIS — Z1231 Encounter for screening mammogram for malignant neoplasm of breast: Secondary | ICD-10-CM | POA: Diagnosis not present

## 2020-08-29 DIAGNOSIS — L821 Other seborrheic keratosis: Secondary | ICD-10-CM | POA: Diagnosis not present

## 2020-09-02 DIAGNOSIS — H4720 Unspecified optic atrophy: Secondary | ICD-10-CM | POA: Diagnosis not present

## 2020-09-02 DIAGNOSIS — H25813 Combined forms of age-related cataract, bilateral: Secondary | ICD-10-CM | POA: Diagnosis not present

## 2020-09-02 DIAGNOSIS — H52203 Unspecified astigmatism, bilateral: Secondary | ICD-10-CM | POA: Diagnosis not present

## 2020-09-02 DIAGNOSIS — H43813 Vitreous degeneration, bilateral: Secondary | ICD-10-CM | POA: Diagnosis not present

## 2020-09-21 ENCOUNTER — Ambulatory Visit (HOSPITAL_COMMUNITY)
Admission: RE | Admit: 2020-09-21 | Discharge: 2020-09-21 | Disposition: A | Discharge status: Home / Self Care | Payer: Medicare Other | Source: Ambulatory Visit | Attending: Pulmonary Disease | Admitting: Pulmonary Disease

## 2020-09-21 ENCOUNTER — Other Ambulatory Visit (HOSPITAL_COMMUNITY): Payer: Self-pay | Admitting: Adult Health

## 2020-09-21 DIAGNOSIS — U071 COVID-19: Secondary | ICD-10-CM

## 2020-09-21 DIAGNOSIS — Z23 Encounter for immunization: Secondary | ICD-10-CM | POA: Insufficient documentation

## 2020-09-21 MED ORDER — ALBUTEROL SULFATE HFA 108 (90 BASE) MCG/ACT IN AERS: 2.0000 | INHALATION_SPRAY | Freq: Once | RESPIRATORY_TRACT | Status: DC | PRN

## 2020-09-21 MED ORDER — EPINEPHRINE 0.3 MG/0.3ML IJ SOAJ: 0.3000 mg | Freq: Once | INTRAMUSCULAR | Status: DC | PRN

## 2020-09-21 MED ORDER — METHYLPREDNISOLONE SODIUM SUCC 125 MG IJ SOLR: 125.0000 mg | Freq: Once | INTRAMUSCULAR | Status: DC | PRN

## 2020-09-21 MED ORDER — SODIUM CHLORIDE 0.9 % IV SOLN: INTRAVENOUS | Status: DC | PRN

## 2020-09-21 MED ORDER — DIPHENHYDRAMINE HCL 50 MG/ML IJ SOLN: 50.0000 mg | Freq: Once | INTRAMUSCULAR | Status: DC | PRN

## 2020-09-21 MED ORDER — FAMOTIDINE IN NACL 20-0.9 MG/50ML-% IV SOLN: 20.0000 mg | Freq: Once | INTRAVENOUS | Status: DC | PRN

## 2020-09-21 MED ORDER — SODIUM CHLORIDE 0.9 % IV SOLN: Freq: Once | INTRAVENOUS | Status: AC

## 2020-09-21 NOTE — Progress Notes (Signed)
  Diagnosis: COVID-19  Physician:  Patrick Wright  Procedure: Covid Infusion Clinic Med: bamlanivimab\etesevimab infusion - Provided patient with bamlanimivab\etesevimab fact sheet for patients, parents and caregivers prior to infusion.  Complications: No immediate complications noted.  Discharge: Discharged home   Regene Mccarthy L 09/21/2020   

## 2020-09-21 NOTE — Progress Notes (Signed)
I connected by phone with Karen Walsh on 09/21/2020 at 12:15 PM to discuss the potential use of a new treatment for mild to moderate COVID-19 viral infection in non-hospitalized patients.  This patient is a 79 y.o. female that meets the FDA criteria for Emergency Use Authorization of COVID monoclonal antibody casirivimab/imdevimab, bamlanivimab/eteseviamb, or sotrovimab.  Has a (+) direct SARS-CoV-2 viral test result  Has mild or moderate COVID-19   Is NOT hospitalized due to COVID-19  Is within 10 days of symptom onset  Has at least one of the high risk factor(s) for progression to severe COVID-19 and/or hospitalization as defined in EUA.  Specific high risk criteria : Older age (>/= 77 yo)   I have spoken and communicated the following to the patient or parent/caregiver regarding COVID monoclonal antibody treatment:  1. FDA has authorized the emergency use for the treatment of mild to moderate COVID-19 in adults and pediatric patients with positive results of direct SARS-CoV-2 viral testing who are 67 years of age and older weighing at least 40 kg, and who are at high risk for progressing to severe COVID-19 and/or hospitalization.  2. The significant known and potential risks and benefits of COVID monoclonal antibody, and the extent to which such potential risks and benefits are unknown.  3. Information on available alternative treatments and the risks and benefits of those alternatives, including clinical trials.  4. Patients treated with COVID monoclonal antibody should continue to self-isolate and use infection control measures (e.g., wear mask, isolate, social distance, avoid sharing personal items, clean and disinfect "high touch" surfaces, and frequent handwashing) according to CDC guidelines.   5. The patient or parent/caregiver has the option to accept or refuse COVID monoclonal antibody treatment. 6. Sx onset 09/15/20, cost reviewed  After reviewing this information with  the patient, the patient has agreed to receive one of the available covid 19 monoclonal antibodies and will be provided an appropriate fact sheet prior to infusion. Scot Dock, NP 09/21/2020 12:15 PM

## 2020-09-21 NOTE — Discharge Instructions (Signed)
10 Things You Can Do to Manage Your COVID-19 Symptoms at Home If you have possible or confirmed COVID-19: 1. Stay home from work and school. And stay away from other public places. If you must go out, avoid using any kind of public transportation, ridesharing, or taxis. 2. Monitor your symptoms carefully. If your symptoms get worse, call your healthcare provider immediately. 3. Get rest and stay hydrated. 4. If you have a medical appointment, call the healthcare provider ahead of time and tell them that you have or may have COVID-19. 5. For medical emergencies, call 911 and notify the dispatch personnel that you have or may have COVID-19. 6. Cover your cough and sneezes with a tissue or use the inside of your elbow. 7. Wash your hands often with soap and water for at least 20 seconds or clean your hands with an alcohol-based hand sanitizer that contains at least 60% alcohol. 8. As much as possible, stay in a specific room and away from other people in your home. Also, you should use a separate bathroom, if available. If you need to be around other people in or outside of the home, wear a mask. 9. Avoid sharing personal items with other people in your household, like dishes, towels, and bedding. 10. Clean all surfaces that are touched often, like counters, tabletops, and doorknobs. Use household cleaning sprays or wipes according to the label instructions. cdc.gov/coronavirus 04/12/2019 This information is not intended to replace advice given to you by your health care provider. Make sure you discuss any questions you have with your health care provider. Document Revised: 09/14/2019 Document Reviewed: 09/14/2019 Elsevier Patient Education  2020 Elsevier Inc. What types of side effects do monoclonal antibody drugs cause?  Common side effects  In general, the more common side effects caused by monoclonal antibody drugs include: . Allergic reactions, such as hives or itching . Flu-like signs and  symptoms, including chills, fatigue, fever, and muscle aches and pains . Nausea, vomiting . Diarrhea . Skin rashes . Low blood pressure   The CDC is recommending patients who receive monoclonal antibody treatments wait at least 90 days before being vaccinated.  Currently, there are no data on the safety and efficacy of mRNA COVID-19 vaccines in persons who received monoclonal antibodies or convalescent plasma as part of COVID-19 treatment. Based on the estimated half-life of such therapies as well as evidence suggesting that reinfection is uncommon in the 90 days after initial infection, vaccination should be deferred for at least 90 days, as a precautionary measure until additional information becomes available, to avoid interference of the antibody treatment with vaccine-induced immune responses. If you have any questions or concerns after the infusion please call the Advanced Practice Provider on call at [phone]. This number is ONLY intended for your use regarding questions or concerns about the infusion post-treatment side-effects.  Please do not provide this number to others for use. For return to work notes please contact your primary care provider.   If someone you know is interested in receiving treatment please have them call the COVID hotline at [phone].   

## 2020-09-21 NOTE — Progress Notes (Signed)
Patient reviewed Fact Sheet for Patients, Parents, and Caregivers for Emergency Use Authorization (EUA) of Bamlanivimab for the Treatment of Coronavirus. Patient also reviewed and is agreeable to the estimated cost of treatment. Patient is agreeable to proceed.   

## 2020-09-23 ENCOUNTER — Ambulatory Visit (HOSPITAL_COMMUNITY): Payer: PPO

## 2020-10-01 DIAGNOSIS — H11001 Unspecified pterygium of right eye: Secondary | ICD-10-CM | POA: Diagnosis not present

## 2020-10-01 DIAGNOSIS — H25811 Combined forms of age-related cataract, right eye: Secondary | ICD-10-CM | POA: Diagnosis not present

## 2020-10-01 DIAGNOSIS — H268 Other specified cataract: Secondary | ICD-10-CM | POA: Diagnosis not present

## 2020-10-08 DIAGNOSIS — H25812 Combined forms of age-related cataract, left eye: Secondary | ICD-10-CM | POA: Diagnosis not present

## 2020-10-08 DIAGNOSIS — H268 Other specified cataract: Secondary | ICD-10-CM | POA: Diagnosis not present

## 2020-10-08 DIAGNOSIS — H52202 Unspecified astigmatism, left eye: Secondary | ICD-10-CM | POA: Diagnosis not present

## 2020-11-09 ENCOUNTER — Other Ambulatory Visit: Payer: Self-pay

## 2020-11-09 ENCOUNTER — Ambulatory Visit (HOSPITAL_COMMUNITY)
Admission: EM | Admit: 2020-11-09 | Discharge: 2020-11-09 | Disposition: A | Discharge status: Home / Self Care | Payer: PPO | Attending: Emergency Medicine | Admitting: Emergency Medicine

## 2020-11-09 ENCOUNTER — Encounter (HOSPITAL_COMMUNITY): Payer: Self-pay

## 2020-11-09 DIAGNOSIS — R112 Nausea with vomiting, unspecified: Secondary | ICD-10-CM | POA: Diagnosis not present

## 2020-11-09 MED ORDER — ONDANSETRON HCL 4 MG PO TABS: 4.0000 mg | ORAL_TABLET | Freq: Four times a day (QID) | ORAL | 0 refills | Status: AC | PRN

## 2020-11-09 MED ORDER — ONDANSETRON 4 MG PO TBDP
4.0000 mg | ORAL_TABLET | Freq: Once | ORAL | Status: AC
  Administered 2020-11-09: 4 mg via ORAL

## 2020-11-09 MED ORDER — ONDANSETRON 4 MG PO TBDP
ORAL_TABLET | ORAL | Status: AC
  Filled 2020-11-09: qty 1

## 2020-11-09 NOTE — ED Triage Notes (Signed)
Patient has experienced burning while urinating as well as urinary urgency since Wednesday. Pt tried otc meds and they did not work. Pt states she went to a provider and was given an antibiotic but it had moderate side effects. Pt has received a new antibiotic today and is now nauseous. Pt is aox4 and ambulatory.

## 2020-11-09 NOTE — ED Provider Notes (Signed)
[facility]    CSN: [id number] Arrival date & time: 11/09/20  1643      History   Chief Complaint Chief Complaint  Patient presents with  . Urinary Frequency    Since wednesday    HPI Karen Walsh is a 80 y.o. female.   Patient presents with nausea and vomiting today.  2 episodes of emesis this afternoon.  She is currently on an antibiotic for a UTI and states the medication is causing her nausea and vomiting.  She was put on Macrobid on 11/06/2020 her PCP but stopped taking it due to insomnia and malaise; her PCP started her on a new antibiotic today.  She denies fever, chills, abdominal pain, diarrhea, constipation, or other symptoms.  Her medical history includes diverticulitis, C. difficile, hemorrhoids, anemia, hypothyroidism, arthritis, nephritis as a child.  The history is provided by the patient and medical records.    Past Medical History:  Diagnosis Date  . Anemia    History of prior to hysterectomy  . Arthritis    thumb, right hip  . Clostridium difficile infection 2017  . Cold hands and feet   . Colon polyps   . Diverticulosis 08/04/2012   noted on colonoscopy  . Hypothyroidism   . Internal hemorrhoids   . Nephritis    childhood  . PONV (postoperative nausea and vomiting)   . Thyroid disease     Patient Active Problem List   Diagnosis Date Noted  . 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 COLONIC POLYPS 08/03/2008  . HEMORRHOIDS, INTERNAL 08/03/2008  . DIVERTICULOSIS, COLON 08/03/2008    Past Surgical History:  Procedure Laterality Date  . COLONOSCOPY    . COLONOSCOPY W/ POLYPECTOMY  08/04/2012  . POLYPECTOMY    . TONSILLECTOMY     childhood  . TOTAL HIP ARTHROPLASTY Right 10/26/2017   Procedure: RIGHT TOTAL HIP ARTHROPLASTY ANTERIOR APPROACH;  Surgeon: Paralee Cancel, MD;  Location: [facility];  Service: Orthopedics;  Laterality: Right;  70 mins  . TUBAL LIGATION    .  VAGINAL HYSTERECTOMY  1993    OB History   No obstetric history on file.      Home Medications    Prior to Admission medications   Medication Sig Start Date End Date Taking? Authorizing Provider  Ascorbic Acid (VITAMIN C) 1000 MG tablet Take 1,000-2,000 mg by mouth See admin instructions. Take 1000 mg by mouth in the morning and take 2000 mg by mouth at bedtime   Yes [provider]  aspirin 81 MG tablet aspirin 81 mg daily   Yes [provider]  Calcium Citrate-Vitamin D (CITRACAL + D PO) Take 1 tablet by mouth 2 (two) times daily.   Yes [provider]  carboxymethylcellulose (REFRESH PLUS) 0.5 % SOLN Place 2 drops into both eyes daily.   Yes [provider]  Cobalamine Combinations (B12 FOLATE PO) Take 1 capsule by mouth daily.   Yes [provider]  levothyroxine (SYNTHROID, LEVOTHROID) 100 MCG tablet Take 100 mcg by mouth daily before breakfast.   Yes [provider]  liothyronine (CYTOMEL) 25 MCG tablet Take 25 mcg by mouth daily. 07/20/16  Yes [provider]  ondansetron (ZOFRAN) 4 MG tablet Take 1 tablet (4 mg total) by mouth every 6 (six) hours as needed for nausea or vomiting. 11/09/20  Yes Sharion Balloon, NP  vitamin B-12 (CYANOCOBALAMIN) 500 MCG tablet Take 500 mcg by mouth  daily.   Yes [provider]  docusate sodium (COLACE) 100 MG capsule Take 1 capsule (100 mg total) by mouth 2 (two) times daily. Patient not taking: No sig reported 10/26/17   Danae Orleans, PA-C  MAGNESIUM CITRATE PO Take 400 mg by mouth 2 (two) times daily.     [provider]  methocarbamol (ROBAXIN) 500 MG tablet Take 1 tablet (500 mg total) by mouth every 6 (six) hours as needed for muscle spasms. Patient not taking: No sig reported 10/26/17   Danae Orleans, PA-C  psyllium (METAMUCIL) 58.6 % packet Take 1 packet by mouth at bedtime.    [provider]    Family History Family History  Problem Relation Age of  Onset  . Colon polyps Daughter   . Breast cancer Daughter   . Breast cancer Mother   . Breast cancer Sister   . Colon cancer Neg Hx   . Esophageal cancer Neg Hx   . Rectal cancer Neg Hx   . Stomach cancer Neg Hx     Social History Social History   Tobacco Use  . Smoking status: Former Smoker    Start date: 10/12/1973  . Smokeless tobacco: Never Used  Vaping Use  . Vaping Use: Never used  Substance Use Topics  . Alcohol use: Yes    Alcohol/week: 14.0 standard drinks    Types: 14 Standard drinks or equivalent per week  . Drug use: No     Allergies   Oxycodone   Review of Systems Review of Systems  Constitutional: Negative for chills and fever.  HENT: Negative for ear pain and sore throat.   Eyes: Negative for pain and visual disturbance.  Respiratory: Negative for cough and shortness of breath.   Cardiovascular: Negative for chest pain and palpitations.  Gastrointestinal: Positive for nausea and vomiting. Negative for abdominal pain, constipation and diarrhea.  Genitourinary: Negative for dysuria and hematuria.  Musculoskeletal: Negative for arthralgias and back pain.  Skin: Negative for color change and rash.  Neurological: Negative for seizures and syncope.  All other systems reviewed and are negative.    Physical Exam Triage Vital Signs ED Triage Vitals  Enc Vitals Group     BP      Pulse      Resp      Temp      Temp src      SpO2      Weight      Height      Head Circumference      Peak Flow      Pain Score      Pain Loc      Pain Edu?      Excl. in [place]?    No data found.  Updated Vital Signs BP (!) 154/83 (BP Location: Right Arm)   Pulse 85   Temp 98.2 F (36.8 C) (Oral)   Resp 17   SpO2 100%   Visual Acuity Right Eye Distance:   Left Eye Distance:   Bilateral Distance:    Right Eye Near:   Left Eye Near:    Bilateral Near:     Physical Exam Vitals and nursing note reviewed.  Constitutional:      General: She is not in acute  distress.    Appearance: She is well-developed and well-nourished.  HENT:     Head: Normocephalic and atraumatic.     Mouth/Throat:     Mouth: Mucous membranes are moist.     Pharynx: Oropharynx is  clear.  Eyes:     Conjunctiva/sclera: Conjunctivae normal.  Cardiovascular:     Rate and Rhythm: Normal rate and regular rhythm.     Heart sounds: Normal heart sounds.  Pulmonary:     Effort: Pulmonary effort is normal. No respiratory distress.     Breath sounds: Normal breath sounds.  Abdominal:     General: Bowel sounds are normal. There is no distension.     Palpations: Abdomen is soft.     Tenderness: There is no abdominal tenderness. There is no right CVA tenderness, left CVA tenderness, guarding or rebound.  Musculoskeletal:        General: No edema.     Cervical back: Neck supple.  Skin:    General: Skin is warm and dry.  Neurological:     General: No focal deficit present.     Mental Status: She is alert and oriented to person, place, and time.     Gait: Gait normal.  Psychiatric:        Mood and Affect: Mood and affect and mood normal.        Behavior: Behavior normal.      UC Treatments / Results  Labs (all labs ordered are listed, but only abnormal results are displayed) Labs Reviewed - No data to display  EKG   Radiology No results found.  Procedures Procedures (including critical care time)  Medications Ordered in UC Medications  ondansetron (ZOFRAN-ODT) disintegrating tablet 4 mg (4 mg Oral Given 11/09/20 1727)    Initial Impression / Assessment and Plan / UC Course  I have reviewed the triage vital signs and the nursing notes.  Pertinent labs & imaging results that were available during my care of the patient were reviewed by me and considered in my medical decision making (see chart for details).   Nausea with vomiting.  Zofran given here and patient able to consume ginger ale without emesis.  She states she is feeling much better.  Treating with  continued use of Zofran as needed.  Instructed patient to herself hydrated with clear liquids.  Discussed that she needs to follow-up with her PCP on Monday, especially if she is unable to take the antibiotic for her UTI.  ED precautions discussed.  Patient agrees to plan of care.   Final Clinical Impressions(s) / UC Diagnoses   Final diagnoses:  Non-intractable vomiting with nausea, unspecified vomiting type     Discharge Instructions     Take the antinausea medication as directed.    Keep yourself hydrated with clear liquids, such as water, Gatorade, Pedialyte, Sprite, or ginger ale.    Go to the emergency department if you have acute worsening symptoms.    Follow up with your primary care provider on Monday.          ED Prescriptions    Medication Sig Dispense Auth. Provider   ondansetron (ZOFRAN) 4 MG tablet Take 1 tablet (4 mg total) by mouth every 6 (six) hours as needed for nausea or vomiting. 12 tablet Sharion Balloon, NP     PDMP not reviewed this encounter.   Sharion Balloon, NP 11/09/20 [phone]

## 2020-11-09 NOTE — ED Notes (Signed)
Pt given ginger al  To drink

## 2020-11-09 NOTE — Discharge Instructions (Signed)
Take the antinausea medication as directed.    Keep yourself hydrated with clear liquids, such as water, Gatorade, Pedialyte, Sprite, or ginger ale.    Go to the emergency department if you have acute worsening symptoms.    Follow up with your primary care provider on Monday.

## 2021-02-25 DIAGNOSIS — B372 Candidiasis of skin and nail: Secondary | ICD-10-CM | POA: Diagnosis not present

## 2021-02-28 DIAGNOSIS — L309 Dermatitis, unspecified: Secondary | ICD-10-CM | POA: Diagnosis not present

## 2021-03-20 DIAGNOSIS — L309 Dermatitis, unspecified: Secondary | ICD-10-CM | POA: Diagnosis not present

## 2021-05-01 DIAGNOSIS — M8589 Other specified disorders of bone density and structure, multiple sites: Secondary | ICD-10-CM | POA: Diagnosis not present

## 2021-05-01 DIAGNOSIS — Z780 Asymptomatic menopausal state: Secondary | ICD-10-CM | POA: Diagnosis not present

## 2021-05-27 DIAGNOSIS — Z7689 Persons encountering health services in other specified circumstances: Secondary | ICD-10-CM | POA: Diagnosis not present

## 2021-05-27 DIAGNOSIS — E039 Hypothyroidism, unspecified: Secondary | ICD-10-CM | POA: Diagnosis not present

## 2021-06-03 DIAGNOSIS — Z0000 Encounter for general adult medical examination without abnormal findings: Secondary | ICD-10-CM | POA: Diagnosis not present

## 2021-06-03 DIAGNOSIS — Z780 Asymptomatic menopausal state: Secondary | ICD-10-CM | POA: Diagnosis not present

## 2021-06-03 DIAGNOSIS — R6889 Other general symptoms and signs: Secondary | ICD-10-CM | POA: Diagnosis not present

## 2021-06-03 DIAGNOSIS — K5900 Constipation, unspecified: Secondary | ICD-10-CM | POA: Diagnosis not present

## 2021-06-03 DIAGNOSIS — R030 Elevated blood-pressure reading, without diagnosis of hypertension: Secondary | ICD-10-CM | POA: Diagnosis not present

## 2021-06-03 DIAGNOSIS — M810 Age-related osteoporosis without current pathological fracture: Secondary | ICD-10-CM | POA: Diagnosis not present

## 2021-06-03 DIAGNOSIS — M1990 Unspecified osteoarthritis, unspecified site: Secondary | ICD-10-CM | POA: Diagnosis not present

## 2021-06-03 DIAGNOSIS — Z1331 Encounter for screening for depression: Secondary | ICD-10-CM | POA: Diagnosis not present

## 2021-06-03 DIAGNOSIS — R82998 Other abnormal findings in urine: Secondary | ICD-10-CM | POA: Diagnosis not present

## 2021-06-03 DIAGNOSIS — H539 Unspecified visual disturbance: Secondary | ICD-10-CM | POA: Diagnosis not present

## 2021-06-03 DIAGNOSIS — N6459 Other signs and symptoms in breast: Secondary | ICD-10-CM | POA: Diagnosis not present

## 2021-06-03 DIAGNOSIS — J310 Chronic rhinitis: Secondary | ICD-10-CM | POA: Diagnosis not present

## 2021-06-03 DIAGNOSIS — E039 Hypothyroidism, unspecified: Secondary | ICD-10-CM | POA: Diagnosis not present

## 2021-07-14 DIAGNOSIS — L821 Other seborrheic keratosis: Secondary | ICD-10-CM | POA: Diagnosis not present

## 2021-08-09 DIAGNOSIS — Z23 Encounter for immunization: Secondary | ICD-10-CM | POA: Diagnosis not present

## 2021-08-18 DIAGNOSIS — Z961 Presence of intraocular lens: Secondary | ICD-10-CM | POA: Diagnosis not present

## 2021-08-18 DIAGNOSIS — H4720 Unspecified optic atrophy: Secondary | ICD-10-CM | POA: Diagnosis not present

## 2021-08-18 DIAGNOSIS — H26493 Other secondary cataract, bilateral: Secondary | ICD-10-CM | POA: Diagnosis not present

## 2021-08-20 DIAGNOSIS — R0981 Nasal congestion: Secondary | ICD-10-CM | POA: Diagnosis not present

## 2021-08-20 DIAGNOSIS — Z1152 Encounter for screening for COVID-19: Secondary | ICD-10-CM | POA: Diagnosis not present

## 2021-08-20 DIAGNOSIS — J069 Acute upper respiratory infection, unspecified: Secondary | ICD-10-CM | POA: Diagnosis not present

## 2021-09-08 DIAGNOSIS — Z01419 Encounter for gynecological examination (general) (routine) without abnormal findings: Secondary | ICD-10-CM | POA: Diagnosis not present

## 2021-09-08 DIAGNOSIS — Z1231 Encounter for screening mammogram for malignant neoplasm of breast: Secondary | ICD-10-CM | POA: Diagnosis not present

## 2021-09-08 DIAGNOSIS — Z124 Encounter for screening for malignant neoplasm of cervix: Secondary | ICD-10-CM | POA: Diagnosis not present

## 2021-09-08 DIAGNOSIS — Z6820 Body mass index (BMI) 20.0-20.9, adult: Secondary | ICD-10-CM | POA: Diagnosis not present

## 2021-09-10 ENCOUNTER — Other Ambulatory Visit: Payer: Self-pay | Admitting: Obstetrics and Gynecology

## 2021-09-10 DIAGNOSIS — R928 Other abnormal and inconclusive findings on diagnostic imaging of breast: Secondary | ICD-10-CM

## 2021-10-07 ENCOUNTER — Ambulatory Visit
Admission: RE | Admit: 2021-10-07 | Discharge: 2021-10-07 | Disposition: A | Discharge status: Home / Self Care | Payer: PPO | Source: Ambulatory Visit | Attending: Obstetrics and Gynecology | Admitting: Obstetrics and Gynecology

## 2021-10-07 ENCOUNTER — Ambulatory Visit: Payer: PPO

## 2021-10-07 DIAGNOSIS — R928 Other abnormal and inconclusive findings on diagnostic imaging of breast: Secondary | ICD-10-CM

## 2021-10-07 DIAGNOSIS — R922 Inconclusive mammogram: Secondary | ICD-10-CM | POA: Diagnosis not present

## 2021-10-09 DIAGNOSIS — L304 Erythema intertrigo: Secondary | ICD-10-CM | POA: Diagnosis not present

## 2021-10-09 DIAGNOSIS — G8929 Other chronic pain: Secondary | ICD-10-CM | POA: Diagnosis not present

## 2021-10-09 DIAGNOSIS — H26493 Other secondary cataract, bilateral: Secondary | ICD-10-CM | POA: Diagnosis not present

## 2021-10-09 DIAGNOSIS — N62 Hypertrophy of breast: Secondary | ICD-10-CM | POA: Diagnosis not present

## 2021-10-09 DIAGNOSIS — H26492 Other secondary cataract, left eye: Secondary | ICD-10-CM | POA: Diagnosis not present

## 2021-10-09 DIAGNOSIS — H26491 Other secondary cataract, right eye: Secondary | ICD-10-CM | POA: Diagnosis not present

## 2021-10-09 DIAGNOSIS — M542 Cervicalgia: Secondary | ICD-10-CM | POA: Diagnosis not present

## 2021-10-20 DIAGNOSIS — U071 COVID-19: Secondary | ICD-10-CM | POA: Diagnosis not present

## 2021-10-20 DIAGNOSIS — R030 Elevated blood-pressure reading, without diagnosis of hypertension: Secondary | ICD-10-CM | POA: Diagnosis not present

## 2021-10-20 DIAGNOSIS — J029 Acute pharyngitis, unspecified: Secondary | ICD-10-CM | POA: Diagnosis not present

## 2021-10-20 DIAGNOSIS — R051 Acute cough: Secondary | ICD-10-CM | POA: Diagnosis not present

## 2021-10-20 DIAGNOSIS — R519 Headache, unspecified: Secondary | ICD-10-CM | POA: Diagnosis not present

## 2021-10-20 DIAGNOSIS — Z1152 Encounter for screening for COVID-19: Secondary | ICD-10-CM | POA: Diagnosis not present

## 2021-11-12 HISTORY — PX: REDUCTION MAMMAPLASTY: SUR839

## 2021-11-20 DIAGNOSIS — E039 Hypothyroidism, unspecified: Secondary | ICD-10-CM | POA: Diagnosis not present

## 2021-11-25 ENCOUNTER — Other Ambulatory Visit: Payer: Self-pay

## 2021-11-25 DIAGNOSIS — M542 Cervicalgia: Secondary | ICD-10-CM | POA: Diagnosis not present

## 2021-11-25 DIAGNOSIS — N62 Hypertrophy of breast: Secondary | ICD-10-CM | POA: Diagnosis not present

## 2021-11-25 DIAGNOSIS — N6012 Diffuse cystic mastopathy of left breast: Secondary | ICD-10-CM | POA: Diagnosis not present

## 2021-11-25 DIAGNOSIS — N6011 Diffuse cystic mastopathy of right breast: Secondary | ICD-10-CM | POA: Diagnosis not present

## 2021-11-25 DIAGNOSIS — L304 Erythema intertrigo: Secondary | ICD-10-CM | POA: Diagnosis not present

## 2021-11-25 DIAGNOSIS — M549 Dorsalgia, unspecified: Secondary | ICD-10-CM | POA: Diagnosis not present

## 2022-02-04 DIAGNOSIS — E039 Hypothyroidism, unspecified: Secondary | ICD-10-CM | POA: Diagnosis not present

## 2022-02-24 IMAGING — MG MM DIGITAL DIAGNOSTIC UNILAT*R* W/ TOMO W/ CAD
6 of 10 series · 6 of 30 positions shown · non-contrast
Comparison: Previous exam(s).

CLINICAL DATA: 79-year-old presenting with RIGHT nipple pain and
crustiness, though the patient denies a nipple discharge.

Strong family history of breast cancer mother, sister and daughter.
Her daughter tested negative for the BRCA gene.
EXAM:
DIGITAL DIAGNOSTIC RIGHT MAMMOGRAM WITH CAD AND TOMO
ULTRASOUND RIGHT BREAST

[R CC synth-2D (1 of 2)]
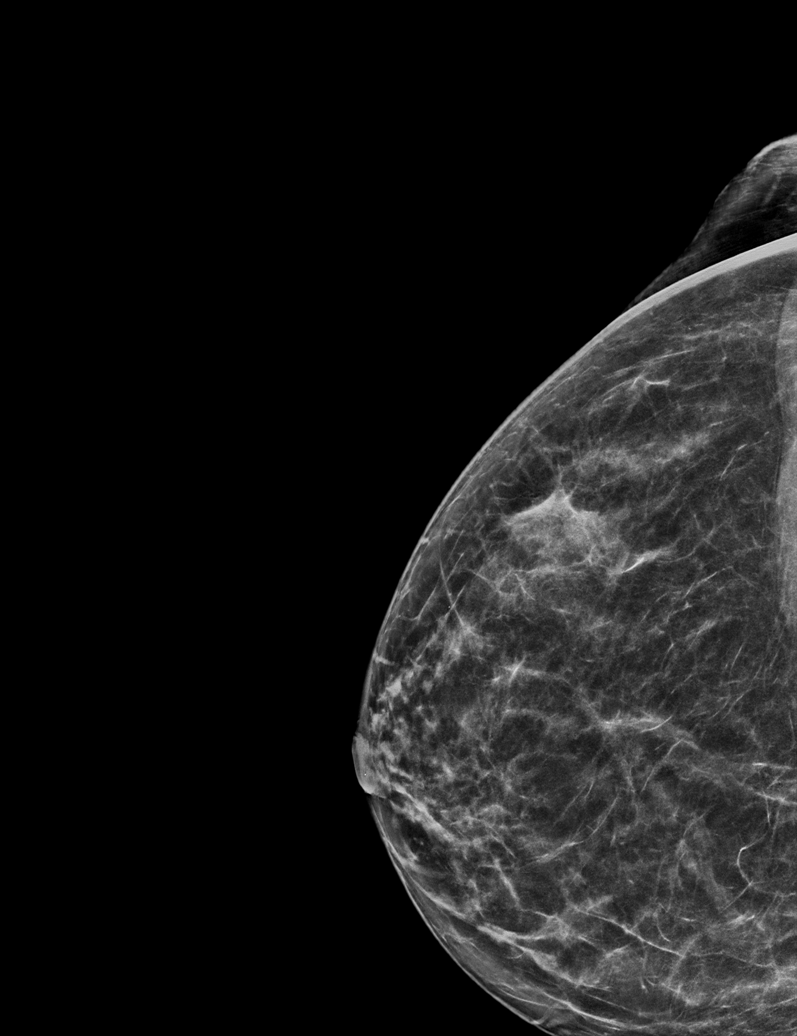

[R CC synth-2D (2 of 2)]
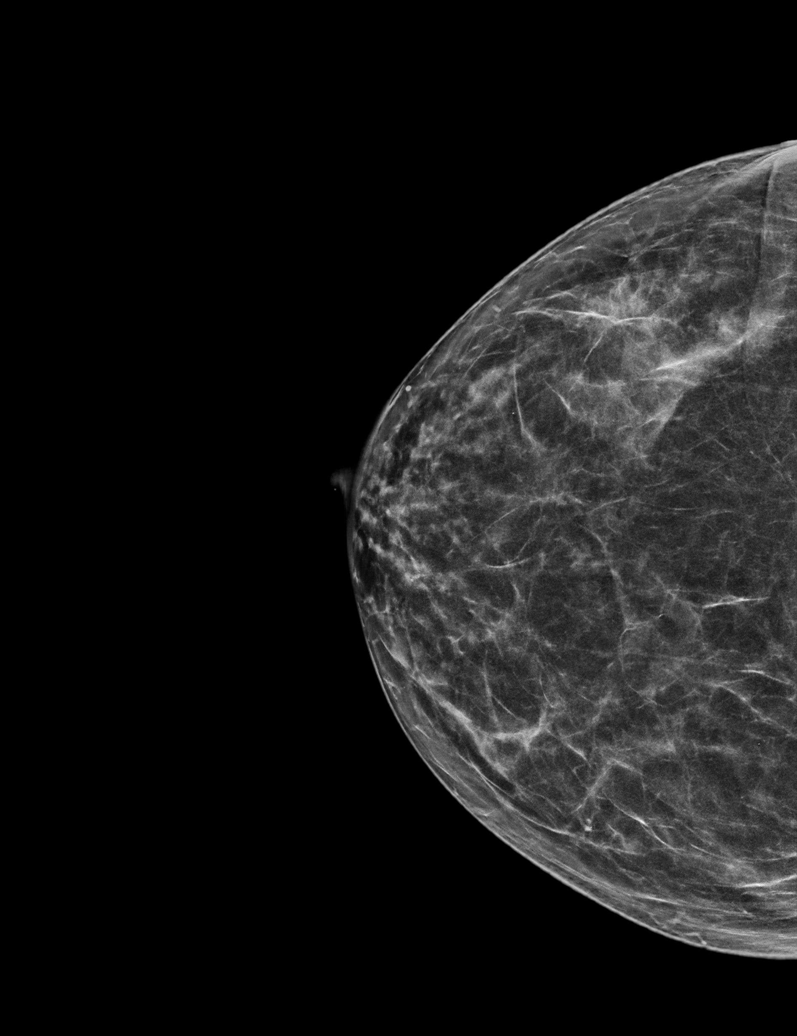

[R MLO synth-2D (1 of 2)]
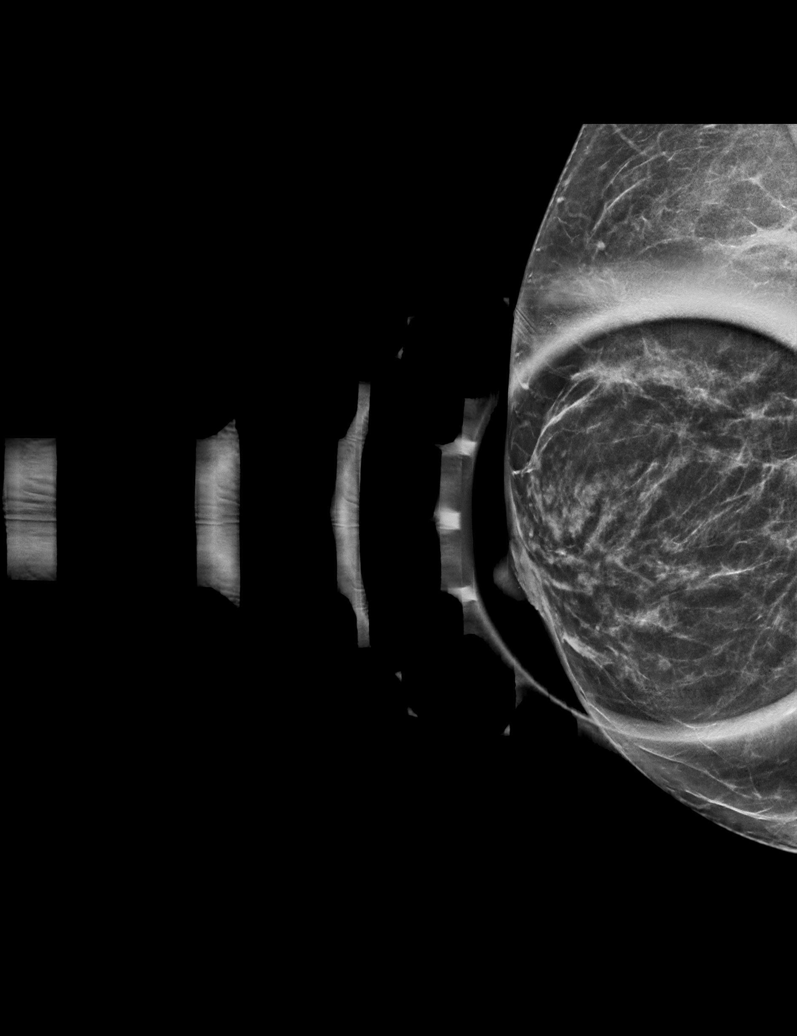

[R MLO synth-2D (2 of 2)]
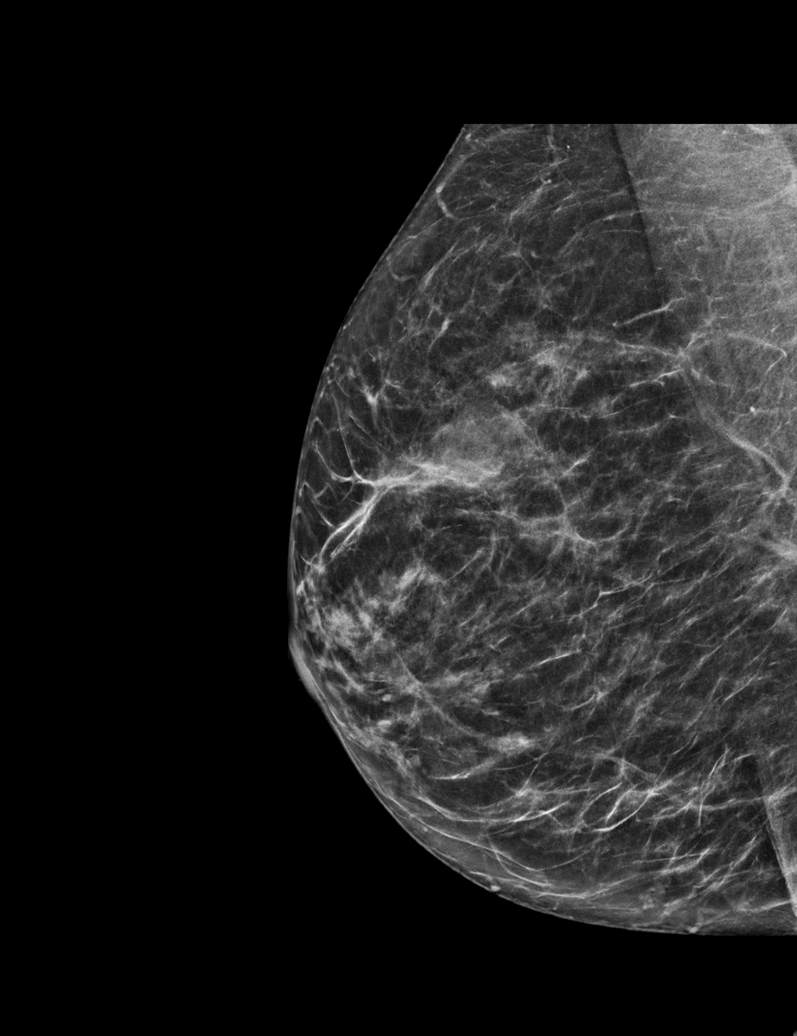

[R ML synth-2D]
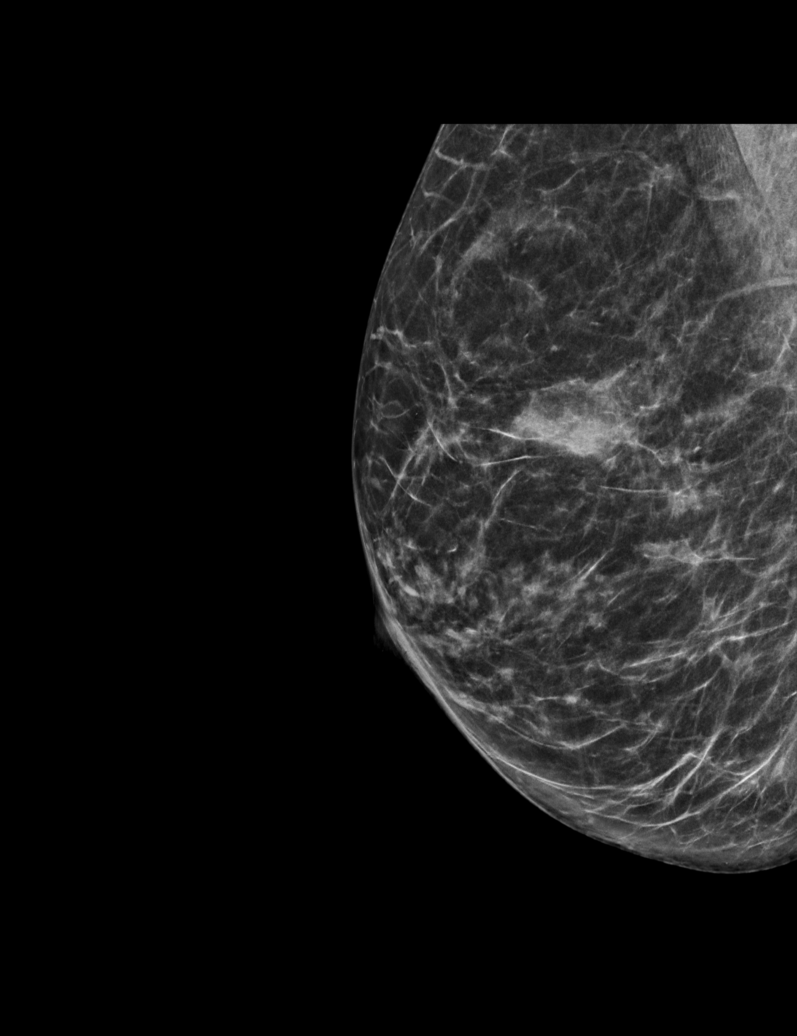

[R CC tomo · tomo slice 33/65.0]
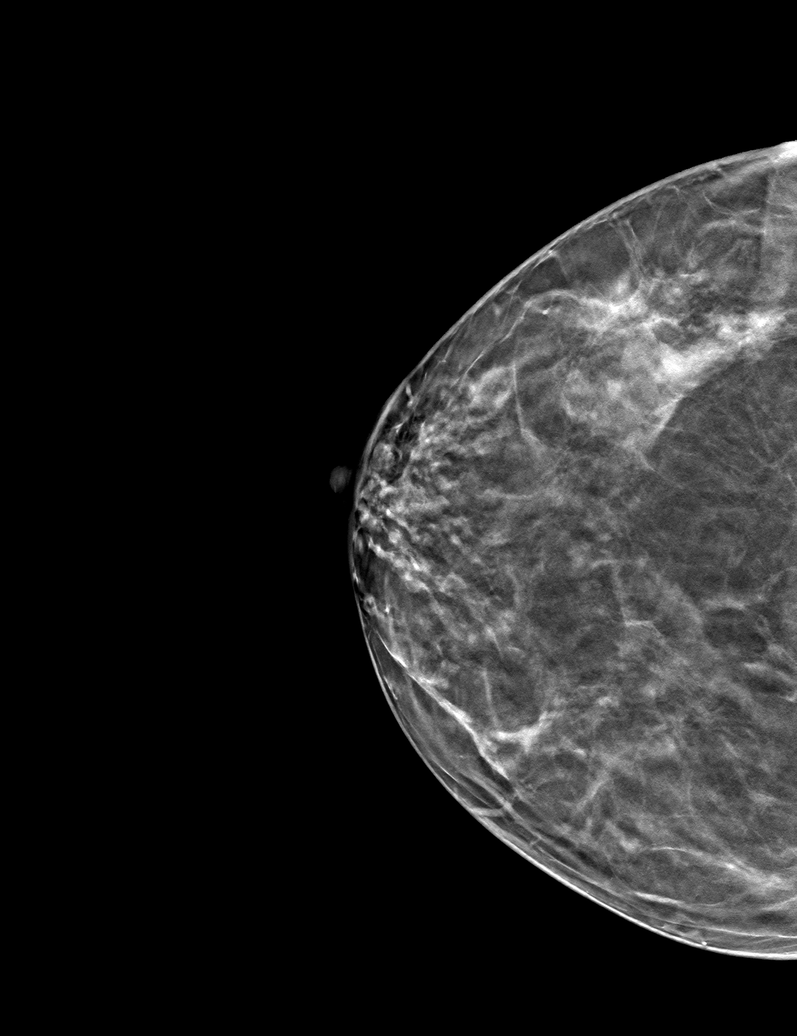

[6 of 30 positions shown; findings below may reference images not displayed]

ACR Breast Density Category b: There are scattered areas of
fibroglandular density.
FINDINGS: Tomosynthesis and synthesized full field CC, MLO and mediolateral
views of the RIGHT breast were obtained. Tomosynthesis and
synthesized spot compression MLO tangential view of the subareolar
RIGHT breast was also obtained.

No mammographic abnormalities in the immediate
subareolar/retronipple location. A possible asymmetry in the lower
retroareolar location at ANTERIOR to MIDDLE depth on the initial MLO
images is inconspicuous on the CC images and on the mediolateral
images and disperses on the spot compression tangential images,
indicating overlapping fibroglandular tissue.

No findings suspicious for malignancy in RIGHT breast.

Mammographic images were processed with CAD.

On correlative physical exam, the RIGHT nipple-areola complex has a
normal appearance and is symmetric with the LEFT side.

Targeted RIGHT breast ultrasound is performed, showing normal
scattered fibroglandular tissue and normal ducts in the subareolar
location. No cyst, solid mass, intraductal mass or abnormal acoustic
shadowing is identified.
IMPRESSION: No mammographic or sonographic evidence of malignancy involving the
RIGHT breast.

RECOMMENDATION:
1. Annual BILATERAL screening mammography which is due in August 2020.
2. I counseled the patient to complete the treatment regimen
prescribed to her by Dr. Ewstebaek. Should the nipple crusting persist
or return after completion of treatment, surgical consultation would
be recommended in order to be sure that there is no evidence of
Paget's disease.

I have discussed the findings and recommendations with the patient.
If applicable, a reminder letter will be sent to the patient
regarding the next appointment.

BI-RADS CATEGORY  1: Negative.

## 2022-03-18 DIAGNOSIS — E039 Hypothyroidism, unspecified: Secondary | ICD-10-CM | POA: Diagnosis not present

## 2022-06-11 DIAGNOSIS — E039 Hypothyroidism, unspecified: Secondary | ICD-10-CM | POA: Diagnosis not present

## 2022-06-11 DIAGNOSIS — M810 Age-related osteoporosis without current pathological fracture: Secondary | ICD-10-CM | POA: Diagnosis not present

## 2022-06-11 DIAGNOSIS — R7989 Other specified abnormal findings of blood chemistry: Secondary | ICD-10-CM | POA: Diagnosis not present

## 2022-06-18 DIAGNOSIS — N6459 Other signs and symptoms in breast: Secondary | ICD-10-CM | POA: Diagnosis not present

## 2022-06-18 DIAGNOSIS — M810 Age-related osteoporosis without current pathological fracture: Secondary | ICD-10-CM | POA: Diagnosis not present

## 2022-06-18 DIAGNOSIS — J310 Chronic rhinitis: Secondary | ICD-10-CM | POA: Diagnosis not present

## 2022-06-18 DIAGNOSIS — R030 Elevated blood-pressure reading, without diagnosis of hypertension: Secondary | ICD-10-CM | POA: Diagnosis not present

## 2022-06-18 DIAGNOSIS — E039 Hypothyroidism, unspecified: Secondary | ICD-10-CM | POA: Diagnosis not present

## 2022-06-18 DIAGNOSIS — H539 Unspecified visual disturbance: Secondary | ICD-10-CM | POA: Diagnosis not present

## 2022-06-18 DIAGNOSIS — K5900 Constipation, unspecified: Secondary | ICD-10-CM | POA: Diagnosis not present

## 2022-06-18 DIAGNOSIS — Z1331 Encounter for screening for depression: Secondary | ICD-10-CM | POA: Diagnosis not present

## 2022-06-18 DIAGNOSIS — Z1389 Encounter for screening for other disorder: Secondary | ICD-10-CM | POA: Diagnosis not present

## 2022-06-18 DIAGNOSIS — R82998 Other abnormal findings in urine: Secondary | ICD-10-CM | POA: Diagnosis not present

## 2022-06-18 DIAGNOSIS — Z0000 Encounter for general adult medical examination without abnormal findings: Secondary | ICD-10-CM | POA: Diagnosis not present

## 2022-06-29 ENCOUNTER — Other Ambulatory Visit: Payer: Self-pay | Admitting: Internal Medicine

## 2022-06-29 DIAGNOSIS — Z1231 Encounter for screening mammogram for malignant neoplasm of breast: Secondary | ICD-10-CM

## 2022-07-29 DIAGNOSIS — Z96641 Presence of right artificial hip joint: Secondary | ICD-10-CM | POA: Diagnosis not present

## 2022-08-01 DIAGNOSIS — Z23 Encounter for immunization: Secondary | ICD-10-CM | POA: Diagnosis not present

## 2022-09-28 DIAGNOSIS — Z01419 Encounter for gynecological examination (general) (routine) without abnormal findings: Secondary | ICD-10-CM | POA: Diagnosis not present

## 2022-09-28 DIAGNOSIS — Z6821 Body mass index (BMI) 21.0-21.9, adult: Secondary | ICD-10-CM | POA: Diagnosis not present

## 2022-10-14 ENCOUNTER — Ambulatory Visit
Admission: RE | Admit: 2022-10-14 | Discharge: 2022-10-14 | Disposition: A | Discharge status: Home / Self Care | Payer: PPO | Source: Ambulatory Visit | Attending: Internal Medicine | Admitting: Internal Medicine

## 2022-10-14 DIAGNOSIS — Z1231 Encounter for screening mammogram for malignant neoplasm of breast: Secondary | ICD-10-CM

## 2022-10-26 DIAGNOSIS — E039 Hypothyroidism, unspecified: Secondary | ICD-10-CM | POA: Diagnosis not present

## 2022-10-26 DIAGNOSIS — R5383 Other fatigue: Secondary | ICD-10-CM | POA: Diagnosis not present

## 2022-11-09 DIAGNOSIS — Z961 Presence of intraocular lens: Secondary | ICD-10-CM | POA: Diagnosis not present

## 2022-11-09 DIAGNOSIS — H52203 Unspecified astigmatism, bilateral: Secondary | ICD-10-CM | POA: Diagnosis not present

## 2022-11-09 DIAGNOSIS — H04123 Dry eye syndrome of bilateral lacrimal glands: Secondary | ICD-10-CM | POA: Diagnosis not present

## 2022-11-09 DIAGNOSIS — H4720 Unspecified optic atrophy: Secondary | ICD-10-CM | POA: Diagnosis not present

## 2022-12-10 DIAGNOSIS — R5383 Other fatigue: Secondary | ICD-10-CM | POA: Diagnosis not present

## 2022-12-10 DIAGNOSIS — E039 Hypothyroidism, unspecified: Secondary | ICD-10-CM | POA: Diagnosis not present

## 2022-12-17 DIAGNOSIS — E039 Hypothyroidism, unspecified: Secondary | ICD-10-CM | POA: Diagnosis not present

## 2023-02-16 DIAGNOSIS — E039 Hypothyroidism, unspecified: Secondary | ICD-10-CM | POA: Diagnosis not present

## 2023-04-19 DIAGNOSIS — E039 Hypothyroidism, unspecified: Secondary | ICD-10-CM | POA: Diagnosis not present

## 2023-05-20 DIAGNOSIS — E039 Hypothyroidism, unspecified: Secondary | ICD-10-CM | POA: Diagnosis not present

## 2023-05-20 DIAGNOSIS — R7989 Other specified abnormal findings of blood chemistry: Secondary | ICD-10-CM | POA: Diagnosis not present

## 2023-06-15 DIAGNOSIS — E039 Hypothyroidism, unspecified: Secondary | ICD-10-CM | POA: Diagnosis not present

## 2023-06-22 DIAGNOSIS — E039 Hypothyroidism, unspecified: Secondary | ICD-10-CM | POA: Diagnosis not present

## 2023-06-23 DIAGNOSIS — L821 Other seborrheic keratosis: Secondary | ICD-10-CM | POA: Diagnosis not present

## 2023-06-23 DIAGNOSIS — D2261 Melanocytic nevi of right upper limb, including shoulder: Secondary | ICD-10-CM | POA: Diagnosis not present

## 2023-06-28 DIAGNOSIS — E039 Hypothyroidism, unspecified: Secondary | ICD-10-CM | POA: Diagnosis not present

## 2023-06-28 DIAGNOSIS — R7989 Other specified abnormal findings of blood chemistry: Secondary | ICD-10-CM | POA: Diagnosis not present

## 2023-06-28 DIAGNOSIS — M810 Age-related osteoporosis without current pathological fracture: Secondary | ICD-10-CM | POA: Diagnosis not present

## 2023-07-05 DIAGNOSIS — E039 Hypothyroidism, unspecified: Secondary | ICD-10-CM | POA: Diagnosis not present

## 2023-07-05 DIAGNOSIS — R6889 Other general symptoms and signs: Secondary | ICD-10-CM | POA: Diagnosis not present

## 2023-07-05 DIAGNOSIS — Z0000 Encounter for general adult medical examination without abnormal findings: Secondary | ICD-10-CM | POA: Diagnosis not present

## 2023-07-05 DIAGNOSIS — R82998 Other abnormal findings in urine: Secondary | ICD-10-CM | POA: Diagnosis not present

## 2023-07-05 DIAGNOSIS — R030 Elevated blood-pressure reading, without diagnosis of hypertension: Secondary | ICD-10-CM | POA: Diagnosis not present

## 2023-07-05 DIAGNOSIS — F900 Attention-deficit hyperactivity disorder, predominantly inattentive type: Secondary | ICD-10-CM | POA: Diagnosis not present

## 2023-07-05 DIAGNOSIS — N6459 Other signs and symptoms in breast: Secondary | ICD-10-CM | POA: Diagnosis not present

## 2023-07-05 DIAGNOSIS — Z1331 Encounter for screening for depression: Secondary | ICD-10-CM | POA: Diagnosis not present

## 2023-07-05 DIAGNOSIS — Z780 Asymptomatic menopausal state: Secondary | ICD-10-CM | POA: Diagnosis not present

## 2023-07-05 DIAGNOSIS — M810 Age-related osteoporosis without current pathological fracture: Secondary | ICD-10-CM | POA: Diagnosis not present

## 2023-07-05 DIAGNOSIS — Z23 Encounter for immunization: Secondary | ICD-10-CM | POA: Diagnosis not present

## 2023-07-05 DIAGNOSIS — J310 Chronic rhinitis: Secondary | ICD-10-CM | POA: Diagnosis not present

## 2023-07-05 DIAGNOSIS — K5900 Constipation, unspecified: Secondary | ICD-10-CM | POA: Diagnosis not present

## 2023-08-07 DIAGNOSIS — Z23 Encounter for immunization: Secondary | ICD-10-CM | POA: Diagnosis not present

## 2023-08-25 DIAGNOSIS — N764 Abscess of vulva: Secondary | ICD-10-CM | POA: Diagnosis not present

## 2023-09-06 ENCOUNTER — Other Ambulatory Visit: Payer: Self-pay | Admitting: Internal Medicine

## 2023-09-06 DIAGNOSIS — Z1231 Encounter for screening mammogram for malignant neoplasm of breast: Secondary | ICD-10-CM

## 2023-10-07 DIAGNOSIS — N959 Unspecified menopausal and perimenopausal disorder: Secondary | ICD-10-CM | POA: Diagnosis not present

## 2023-10-07 DIAGNOSIS — Z124 Encounter for screening for malignant neoplasm of cervix: Secondary | ICD-10-CM | POA: Diagnosis not present

## 2023-10-07 DIAGNOSIS — Z1272 Encounter for screening for malignant neoplasm of vagina: Secondary | ICD-10-CM | POA: Diagnosis not present

## 2023-10-07 DIAGNOSIS — R635 Abnormal weight gain: Secondary | ICD-10-CM | POA: Diagnosis not present

## 2023-10-07 DIAGNOSIS — Z6821 Body mass index (BMI) 21.0-21.9, adult: Secondary | ICD-10-CM | POA: Diagnosis not present

## 2023-10-18 ENCOUNTER — Ambulatory Visit: Payer: PPO

## 2023-10-27 ENCOUNTER — Ambulatory Visit
Admission: RE | Admit: 2023-10-27 | Discharge: 2023-10-27 | Disposition: A | Discharge status: Home / Self Care | Payer: PPO | Source: Ambulatory Visit | Attending: Internal Medicine | Admitting: Internal Medicine

## 2023-10-27 DIAGNOSIS — Z1231 Encounter for screening mammogram for malignant neoplasm of breast: Secondary | ICD-10-CM | POA: Diagnosis not present

## 2023-11-08 DIAGNOSIS — N958 Other specified menopausal and perimenopausal disorders: Secondary | ICD-10-CM | POA: Diagnosis not present

## 2023-11-08 DIAGNOSIS — E2839 Other primary ovarian failure: Secondary | ICD-10-CM | POA: Diagnosis not present

## 2023-11-08 DIAGNOSIS — M8588 Other specified disorders of bone density and structure, other site: Secondary | ICD-10-CM | POA: Diagnosis not present

## 2023-12-20 DIAGNOSIS — E039 Hypothyroidism, unspecified: Secondary | ICD-10-CM | POA: Diagnosis not present

## 2024-01-13 DIAGNOSIS — H4720 Unspecified optic atrophy: Secondary | ICD-10-CM | POA: Diagnosis not present

## 2024-01-13 DIAGNOSIS — H52203 Unspecified astigmatism, bilateral: Secondary | ICD-10-CM | POA: Diagnosis not present

## 2024-01-13 DIAGNOSIS — H04122 Dry eye syndrome of left lacrimal gland: Secondary | ICD-10-CM | POA: Diagnosis not present

## 2024-01-13 DIAGNOSIS — Z961 Presence of intraocular lens: Secondary | ICD-10-CM | POA: Diagnosis not present

## 2024-06-14 DIAGNOSIS — E039 Hypothyroidism, unspecified: Secondary | ICD-10-CM | POA: Diagnosis not present

## 2024-06-21 DIAGNOSIS — E039 Hypothyroidism, unspecified: Secondary | ICD-10-CM | POA: Diagnosis not present

## 2024-06-22 DIAGNOSIS — M25531 Pain in right wrist: Secondary | ICD-10-CM | POA: Diagnosis not present

## 2024-06-29 DIAGNOSIS — N907 Vulvar cyst: Secondary | ICD-10-CM | POA: Diagnosis not present

## 2024-06-29 DIAGNOSIS — L723 Sebaceous cyst: Secondary | ICD-10-CM | POA: Diagnosis not present

## 2024-06-29 DIAGNOSIS — K644 Residual hemorrhoidal skin tags: Secondary | ICD-10-CM | POA: Diagnosis not present

## 2024-06-29 DIAGNOSIS — L089 Local infection of the skin and subcutaneous tissue, unspecified: Secondary | ICD-10-CM | POA: Diagnosis not present

## 2024-07-10 DIAGNOSIS — M810 Age-related osteoporosis without current pathological fracture: Secondary | ICD-10-CM | POA: Diagnosis not present

## 2024-07-10 DIAGNOSIS — R030 Elevated blood-pressure reading, without diagnosis of hypertension: Secondary | ICD-10-CM | POA: Diagnosis not present

## 2024-07-10 DIAGNOSIS — E039 Hypothyroidism, unspecified: Secondary | ICD-10-CM | POA: Diagnosis not present

## 2024-07-17 DIAGNOSIS — E039 Hypothyroidism, unspecified: Secondary | ICD-10-CM | POA: Diagnosis not present

## 2024-07-17 DIAGNOSIS — Z1331 Encounter for screening for depression: Secondary | ICD-10-CM | POA: Diagnosis not present

## 2024-07-17 DIAGNOSIS — Z0000 Encounter for general adult medical examination without abnormal findings: Secondary | ICD-10-CM | POA: Diagnosis not present

## 2024-07-17 DIAGNOSIS — M1990 Unspecified osteoarthritis, unspecified site: Secondary | ICD-10-CM | POA: Diagnosis not present

## 2024-07-17 DIAGNOSIS — Z1339 Encounter for screening examination for other mental health and behavioral disorders: Secondary | ICD-10-CM | POA: Diagnosis not present

## 2024-07-17 DIAGNOSIS — R82998 Other abnormal findings in urine: Secondary | ICD-10-CM | POA: Diagnosis not present

## 2024-07-17 DIAGNOSIS — F900 Attention-deficit hyperactivity disorder, predominantly inattentive type: Secondary | ICD-10-CM | POA: Diagnosis not present

## 2024-07-17 DIAGNOSIS — K5900 Constipation, unspecified: Secondary | ICD-10-CM | POA: Diagnosis not present

## 2024-07-17 DIAGNOSIS — M810 Age-related osteoporosis without current pathological fracture: Secondary | ICD-10-CM | POA: Diagnosis not present

## 2024-07-17 DIAGNOSIS — K649 Unspecified hemorrhoids: Secondary | ICD-10-CM | POA: Diagnosis not present

## 2024-07-17 DIAGNOSIS — Z23 Encounter for immunization: Secondary | ICD-10-CM | POA: Diagnosis not present

## 2024-07-24 DIAGNOSIS — M25561 Pain in right knee: Secondary | ICD-10-CM | POA: Diagnosis not present

## 2024-07-24 DIAGNOSIS — S51811A Laceration without foreign body of right forearm, initial encounter: Secondary | ICD-10-CM | POA: Diagnosis not present

## 2024-07-24 DIAGNOSIS — M25361 Other instability, right knee: Secondary | ICD-10-CM | POA: Diagnosis not present

## 2024-09-12 ENCOUNTER — Other Ambulatory Visit: Payer: Self-pay | Admitting: Internal Medicine

## 2024-09-12 DIAGNOSIS — Z1231 Encounter for screening mammogram for malignant neoplasm of breast: Secondary | ICD-10-CM

## 2024-10-16 DIAGNOSIS — N644 Mastodynia: Secondary | ICD-10-CM

## 2024-10-16 DIAGNOSIS — N6489 Other specified disorders of breast: Secondary | ICD-10-CM

## 2024-10-27 ENCOUNTER — Ambulatory Visit
Admission: RE | Admit: 2024-10-27 | Discharge: 2024-10-27 | Disposition: A | Discharge status: Home / Self Care | Source: Ambulatory Visit | Attending: Obstetrics and Gynecology | Admitting: Obstetrics and Gynecology

## 2024-10-27 DIAGNOSIS — N644 Mastodynia: Secondary | ICD-10-CM

## 2024-10-27 DIAGNOSIS — N6489 Other specified disorders of breast: Secondary | ICD-10-CM

## 2024-11-02 ENCOUNTER — Ambulatory Visit
